# Patient Record
Sex: Female | Born: 1970 | Hispanic: No | Marital: Single | Smoking: Never smoker
Health system: Southern US, Community
[De-identification: ages and names within clinical notes are randomized; demographics above are authoritative.]

---

## 2001-11-09 ENCOUNTER — Other Ambulatory Visit: Admission: RE | Admit: 2001-11-09 | Discharge: 2001-11-09 | Payer: Self-pay | Admitting: *Deleted

## 2001-11-09 ENCOUNTER — Other Ambulatory Visit: Admission: RE | Admit: 2001-11-09 | Discharge: 2001-11-09 | Payer: Self-pay | Admitting: Family Medicine

## 2011-06-23 ENCOUNTER — Emergency Department (HOSPITAL_BASED_OUTPATIENT_CLINIC_OR_DEPARTMENT_OTHER)
Admission: EM | Admit: 2011-06-23 | Discharge: 2011-06-23 | Disposition: A | Discharge status: Home / Self Care | Payer: Worker's Compensation | Attending: Emergency Medicine | Admitting: Emergency Medicine

## 2011-06-23 DIAGNOSIS — M25519 Pain in unspecified shoulder: Secondary | ICD-10-CM | POA: Insufficient documentation

## 2011-06-23 DIAGNOSIS — X58XXXA Exposure to other specified factors, initial encounter: Secondary | ICD-10-CM | POA: Insufficient documentation

## 2011-06-23 NOTE — ED Notes (Signed)
C/o inured right shoulder at work yesterday-was unable to work today

## 2011-06-23 NOTE — ED Provider Notes (Addendum)
History     CSN: [id number] Arrival date & time: 06/23/2011  5:24 PM  Chief Complaint  Patient presents with  . Shoulder Injury     Patient is a 40 y.o. female presenting with shoulder injury.  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  She presents with 24 hours of constant right shoulder pain worse with movement of her right arm.  Her pain is not improved by anything.  Her pain is moderate in severity.  There is no associated weakness or numbness.  She has no other complaints.  She was sent here by her company or drug screen for possible injury on the job and further recommendations regarding return to work. Denies rash or fever. Denies neck pain. Denies Lower extremity complaints  History reviewed. No pertinent past medical history.  History reviewed. No pertinent past surgical history.  No family history on file.  History  Substance Use Topics  . Smoking status: Never Smoker   . Smokeless tobacco: Not on file  . Alcohol Use: No      Review of Systems  All other systems reviewed and are negative.    Allergies  Chocolate and Nsaids  Home Medications   Current Outpatient Rx  Name Route Sig Dispense Refill  . ACETAMINOPHEN 500 MG PO TABS Oral Take 500 mg by mouth once.      Marland Kitchen VITAMIN D (ERGOCALCIFEROL) 50000 UNITS PO CAPS Oral Take 50,000 Units by mouth every 7 (seven) days. Take on Tuesday       BP 121/68  Pulse 58  Temp(Src) 98.5 F (36.9 C) (Oral)  Resp 16  Ht 5\' 3"  (1.6 m)  Wt 170 lb (77.111 kg)  BMI 30.11 kg/m2  SpO2 100%  LMP 06/15/2011  Physical Exam  Nursing note and vitals reviewed. Constitutional: She is oriented to person, place, and time. She appears well-developed and well-nourished.  HENT:  Head: Normocephalic.  Eyes: EOM are normal.  Neck: Normal range of motion.  Pulmonary/Chest: Effort normal.  Musculoskeletal: Normal range of motion.       Mild pain to  palpation of right posterior shoulder.  She's had normal right clavicle and normal right a.c. joint.  No weakness of her right upper extremity.  2 normal right radial pulse.  No evidence of erythema swelling or ecchymosis to  Neurological: She is alert and oriented to person, place, and time.  Psychiatric: She has a normal mood and affect.    ED Course  Procedures (including critical care time)  Labs Reviewed - No data to display No results found.   1. Shoulder pain       MDM  This is well-appearing this is likely a right shoulder strain.  She will will be referred to sports medicine.  She's been instructed to take off work 2 days and then no heavy lifting x5 days        Lyanne Co, MD 06/23/11 1757  Lyanne Co, MD 07/05/11 [phone]

## 2013-02-28 ENCOUNTER — Other Ambulatory Visit: Payer: Self-pay

## 2013-03-05 ENCOUNTER — Encounter (HOSPITAL_COMMUNITY): Payer: Self-pay | Admitting: Obstetrics and Gynecology

## 2013-04-03 ENCOUNTER — Other Ambulatory Visit (HOSPITAL_COMMUNITY): Payer: Self-pay | Admitting: Obstetrics and Gynecology

## 2013-04-03 DIAGNOSIS — O09529 Supervision of elderly multigravida, unspecified trimester: Secondary | ICD-10-CM

## 2013-04-03 DIAGNOSIS — O30009 Twin pregnancy, unspecified number of placenta and unspecified number of amniotic sacs, unspecified trimester: Secondary | ICD-10-CM

## 2013-04-05 ENCOUNTER — Ambulatory Visit (HOSPITAL_COMMUNITY)
Admission: RE | Admit: 2013-04-05 | Discharge: 2013-04-05 | Disposition: A | Discharge status: Home / Self Care | Payer: 59 | Source: Ambulatory Visit | Attending: Obstetrics and Gynecology | Admitting: Obstetrics and Gynecology

## 2013-04-05 ENCOUNTER — Encounter (HOSPITAL_COMMUNITY): Payer: Self-pay

## 2013-04-05 VITALS — BP 89/65 | HR 81 | Wt 189.0 lb

## 2013-04-05 DIAGNOSIS — O09529 Supervision of elderly multigravida, unspecified trimester: Secondary | ICD-10-CM

## 2013-04-05 DIAGNOSIS — Z1389 Encounter for screening for other disorder: Secondary | ICD-10-CM | POA: Insufficient documentation

## 2013-04-05 DIAGNOSIS — Z363 Encounter for antenatal screening for malformations: Secondary | ICD-10-CM | POA: Insufficient documentation

## 2013-04-05 DIAGNOSIS — O358XX0 Maternal care for other (suspected) fetal abnormality and damage, not applicable or unspecified: Secondary | ICD-10-CM | POA: Insufficient documentation

## 2013-04-05 DIAGNOSIS — O30009 Twin pregnancy, unspecified number of placenta and unspecified number of amniotic sacs, unspecified trimester: Secondary | ICD-10-CM | POA: Insufficient documentation

## 2013-04-05 IMAGING — US US OB DETAIL EACH ADDL GEST + 14 WK
1 series · 14 of 28 positions shown · non-contrast
Comparison: none

[Series 1: us ob detail each addl gest + 14 wk · 0.19mm/px · 14 of 143 slices shown]
[im 6/143]
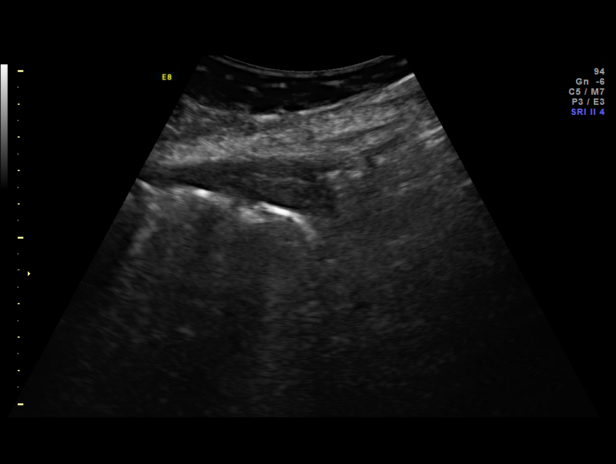
[im 16/143]
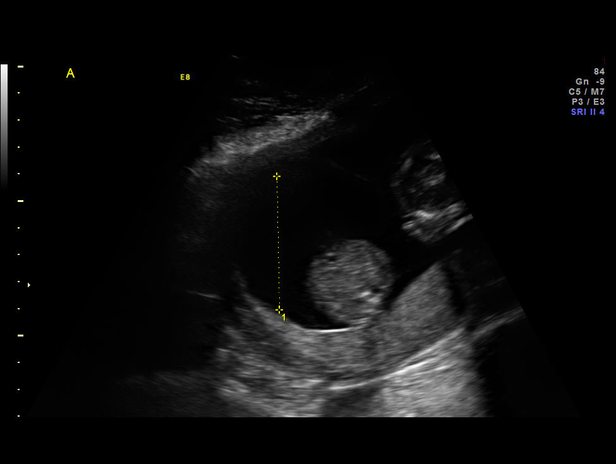
[im 27/143]
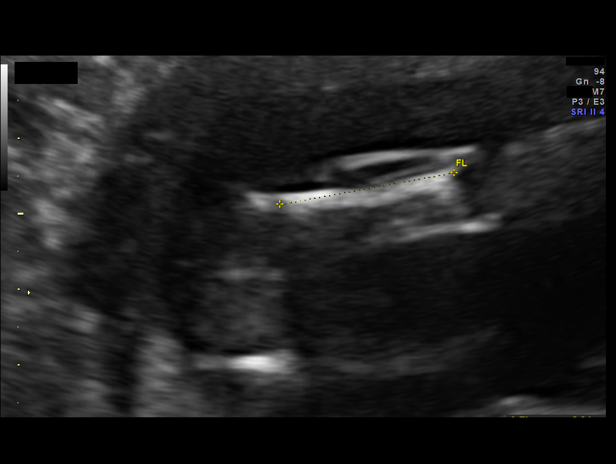
[im 37/143]
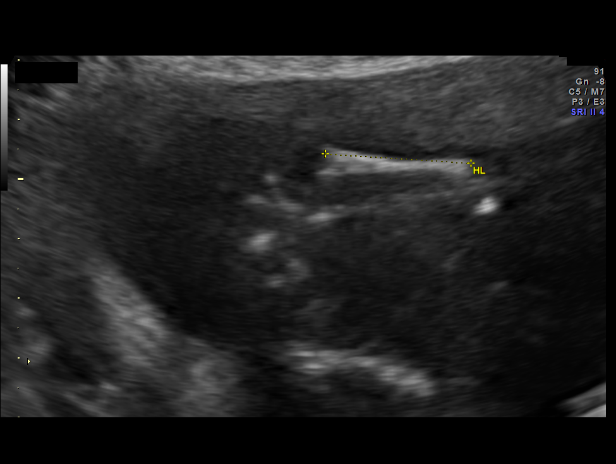
[im 48/143]
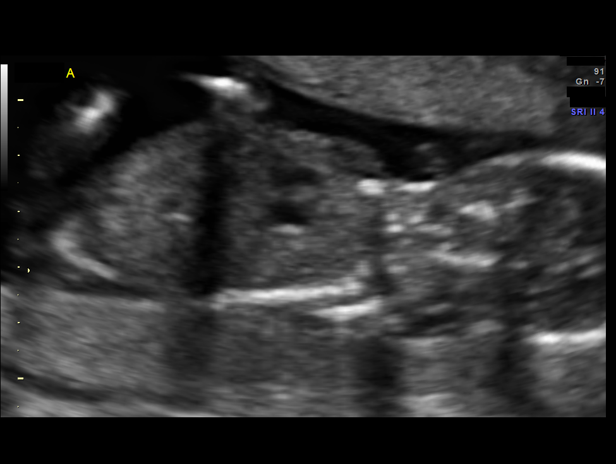
[im 58/143]
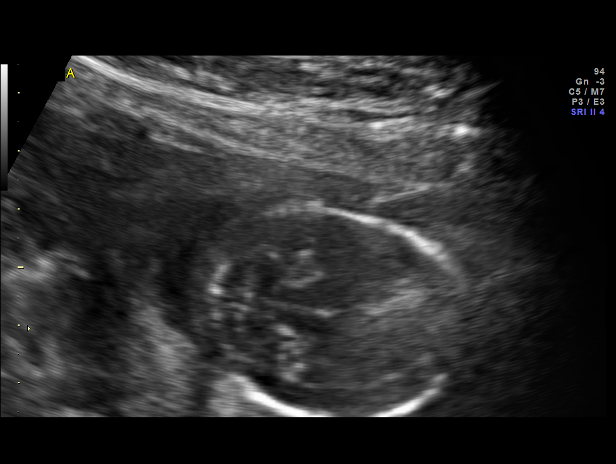
[im 69/143]
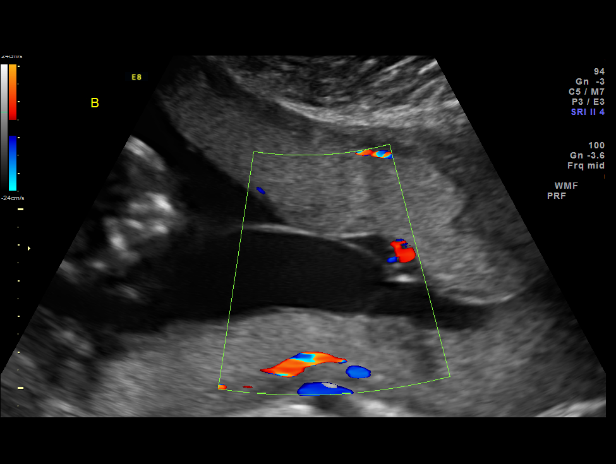
[im 79/143]
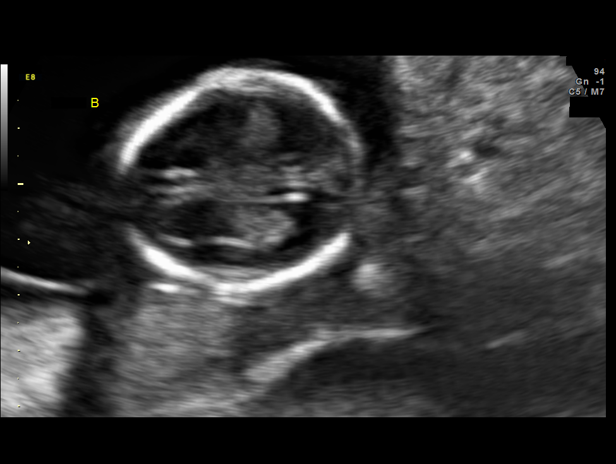
[im 90/143]
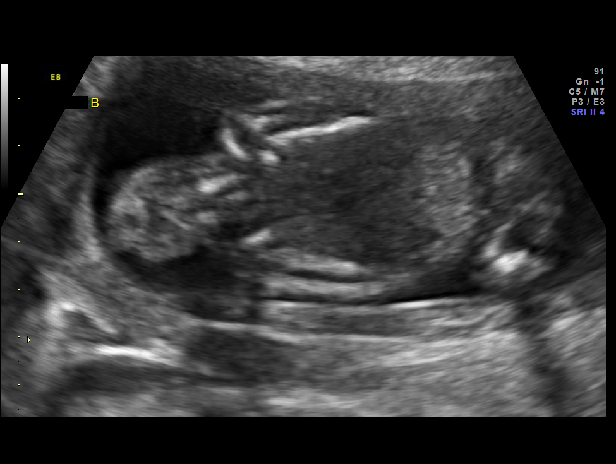
[im 100/143]
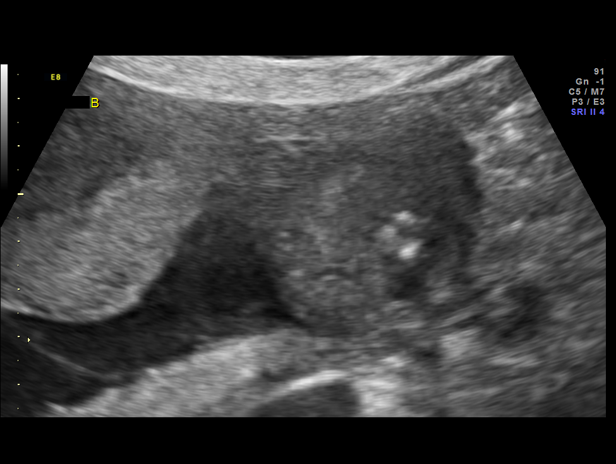
[im 111/143]
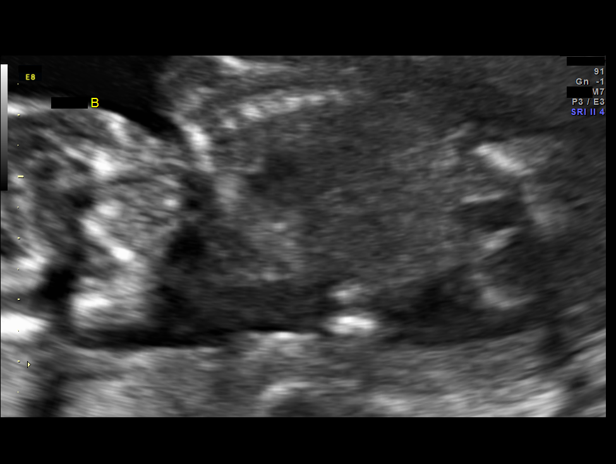
[im 121/143]
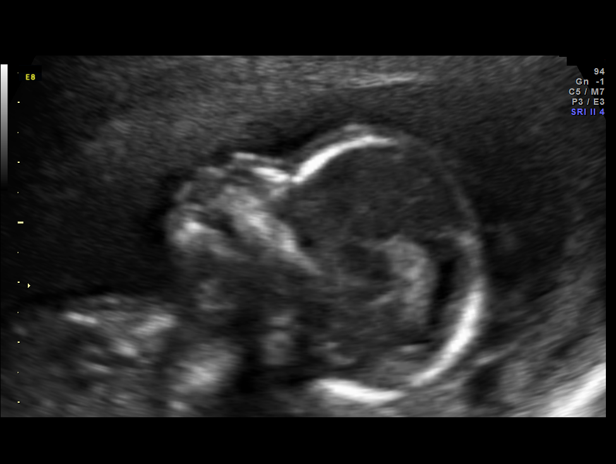
[im 132/143]
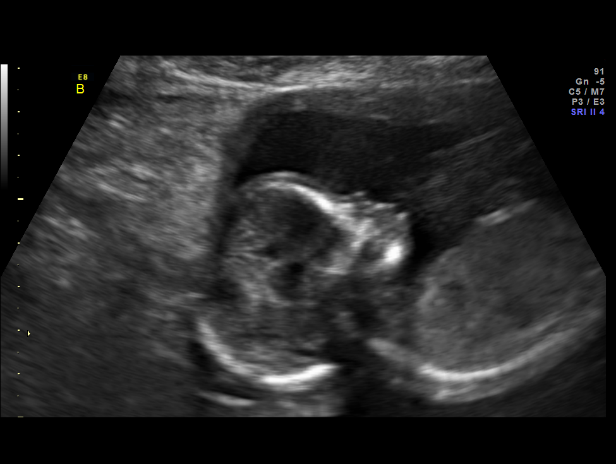
[im 143/143]
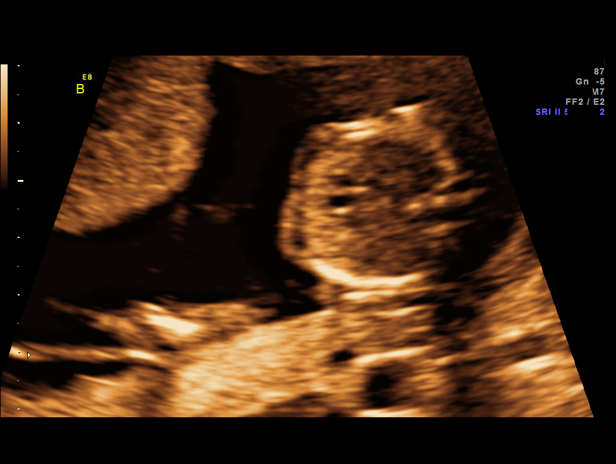

[14 of 28 positions shown; findings below may reference images not displayed]

OBSTETRICS REPORT
                      (Signed Final 04/05/2013 [DATE])

Service(s) Provided

 US OB DETAIL + 14 WK                                  76811.0
 US OB DETAIL ADDL GEST + 14 WK                        76811.1
Indications

 Detailed fetal anatomic survey
 Advanced maternal age (AMA), Multigravida
 Twin gestation, Di-Di
Fetal Evaluation (Fetus A)

 Num Of Fetuses:    2
 Fetal Heart Rate:  150                          bpm
 Cardiac Activity:  Observed
 Fetal Lie:         Lower Right Fetus
 Presentation:      Cephalic
 Placenta:          Posterior, above cervical
                    os
 P. Cord            Visualized
 Insertion:

 Amniotic Fluid
 AFI FV:      Subjectively within normal limits
                                             Larg Pckt:     5.0  cm
Biometry (Fetus A)

 BPD:     34.6  mm     G. Age:  16w 5d                CI:         72.7   70 - 86
 OFD:     47.6  mm                                    FL/HC:      18.2   15.8 -
                                                                         18
 HC:       133  mm     G. Age:  16w 6d        4  %    HC/AC:      1.15   1.07 -

 AC:     115.7  mm     G. Age:  17w 3d       26  %    FL/BPD:
 FL:      24.2  mm     G. Age:  17w 2d       21  %    FL/AC:      20.9   20 - 24
 HUM:     24.5  mm     G. Age:  17w 5d       44  %

 Est. FW:     187  gm      0 lb 7 oz     33  %     FW Discordancy         6  %
Gestational Age (Fetus A)

 U/S Today:     17w 1d                                        EDD:   09/12/13
 Best:          18w 0d     Det. By:  Early Ultrasound         EDD:   09/06/13
Anatomy (Fetus A)

 Cranium:          Appears normal         Aortic Arch:      Appears normal
 Fetal Cavum:      Appears normal         Ductal Arch:      Appears normal
 Ventricles:       Appears normal         Diaphragm:        Appears normal
 Choroid Plexus:   Appears normal         Stomach:          Appears normal, left
                                                            sided
 Cerebellum:       Appears normal         Abdomen:          Appears normal
 Posterior Fossa:  Appears normal         Abdominal Wall:   Appears nml (cord
                                                            insert, abd wall)
 Nuchal Fold:      Appears normal         Cord Vessels:     Appears normal (3
                                                            vessel cord)
 Face:             Appears normal         Kidneys:          Appear normal
                   (orbits and profile)
 Lips:             Appears normal         Bladder:          Appears normal
 Heart:            Appears normal         Spine:            Appears normal
                   (4CH, axis, and
                   situs)
 RVOT:             Appears normal         Lower             Appears normal
                                          Extremities:
 LVOT:             Appears normal         Upper             Appears normal
                                          Extremities:

 Other:  Fetus appears to be a female. Heels and 5th digit appear normal.

Fetal Evaluation (Fetus B)

 Num Of Fetuses:    2
 Fetal Heart Rate:  153                          bpm
 Cardiac Activity:  Observed
 Fetal Lie:         Upper Fetus
 Presentation:      Transverse, head to
                    maternal right
 Placenta:          Anterior, above cervical os
 P. Cord            Visualized
 Insertion:

 Amniotic Fluid
 AFI FV:      Subjectively within normal limits
                                             Larg Pckt:     4.0  cm
Biometry (Fetus B)

 BPD:       38  mm     G. Age:  17w 4d                CI:         76.9   70 - 86
 OFD:     49.4  mm                                    FL/HC:      17.9   15.8 -
                                                                         18
 HC:     139.4  mm     G. Age:  17w 2d       13  %    HC/AC:      1.18   1.07 -

 AC:     118.6  mm     G. Age:  17w 4d       34  %    FL/BPD:
 FL:        25  mm     G. Age:  17w 4d       29  %    FL/AC:      21.1   20 - 24
 HUM:     24.2  mm     G. Age:  17w 4d       41  %
 CER:     16.8  mm     G. Age:  16w 6d       23  %
 NFT:      2.7  mm

 Est. FW:     199  gm      0 lb 7 oz     39  %     FW Discordancy      0 \ 6 %
Gestational Age (Fetus B)

 U/S Today:     17w 4d                                        EDD:   09/09/13
 Best:          18w 0d     Det. By:  Early Ultrasound         EDD:   09/06/13
Anatomy (Fetus B)

 Cranium:          Appears normal         Aortic Arch:      Not well visualized
 Fetal Cavum:      Appears normal         Ductal Arch:      Not well visualized
 Ventricles:       Appears normal         Diaphragm:        Appears normal
 Choroid Plexus:   Appears normal         Stomach:          Appears normal, left
                                                            sided
 Cerebellum:       Appears normal         Abdomen:          Appears normal
 Posterior Fossa:  Appears normal         Abdominal Wall:   Appears nml (cord
                                                            insert, abd wall)
 Nuchal Fold:      Appears normal         Cord Vessels:     Appears normal (3
                                                            vessel cord)
 Face:             Appears normal         Kidneys:          Appear normal
                   (orbits and profile)
 Lips:             Appears normal         Bladder:          Appears normal
 Heart:            Not well visualized    Spine:            Appears normal
 RVOT:             Not well visualized    Lower             Appears normal
                                          Extremities:
 LVOT:             Appears normal         Upper             Appears normal
                                          Extremities:

 Other:  Heels and Lt 5th digit appear normal. Fetus appears to be a male.
Targeted Anatomy (Fetus B)

 Fetal Central Nervous System
 Cisterna Magna:
Cervix Uterus Adnexa

 Cervical Length:    4.5      cm

 Cervix:       Normal appearance by transabdominal scan. Appears
               closed, without funnelling.
 Left Ovary:    Within normal limits.
 Right Ovary:   Within normal limits.
Comments

 Ms. Kinaa was seen for consultation due to advanced
 maternal age and DC/DA twins.  See separate note from
 Genetics Counselor.  After counseling, the patient declined
 further genetic testing.
Impression

 DC/DA twin gestation with best dates of 18 0/7 weeks
 Concordant growth noted.

 Twin A: maternal right, cephalic, posterior placenta
               Normal fetal anatomic survey
               No markers associated with aneuploidy noted
               Normal amniotic fluid volume

 Twin B: transverse with fetal head to maternal right
               Normal fetal anatomic survey
               Limited views of the fetal heart obtained due to fetal
 position
               No markers assocaited with aneuploidy appreciated
               Normal amniotic fluid volume
Recommendations

 Recommend follow-up ultrasound examination in 4 weeks for
 interval growth and to reevaluate fetal heart anatomy in twin
 B.

## 2013-04-05 NOTE — Progress Notes (Signed)
Genetic Counseling  High-Risk Gestation Note  Appointment Date:  04/05/2013 Referred By: Heidi Bailiff, MD Date of Birth:  [birth year]    Pregnancy History: [id number] Estimated Date of Delivery: 09/06/13 Estimated Gestational Age: [redacted]w[redacted]d Attending: Alpha Gula, MD   [name] was seen for genetic counseling because of a maternal age of 42 y.o..     She was counseled regarding maternal age and the association with risk for chromosome conditions due to nondisjunction with aging of the ova.   We reviewed chromosomes, nondisjunction, and the associated 1 in 35 risk for fetal aneuploidy for each twin at [redacted]w[redacted]d gestation related to a maternal age of 42 years old at delivery. The patient reported that she is not certain of her age, thus we discussed that this estimate is based upon the provided birth date that she estimated. She was counseled that the risk for aneuploidy decreases as gestational age increases, accounting for those pregnancies which spontaneously abort.  We specifically discussed Down syndrome (trisomy 12), trisomies 29 and 57, and sex chromosome aneuploidies (47,XXX and 47,XXY) including the common features and prognoses of each.   We reviewed available screening options including Quad screen, noninvasive prenatal screening (NIPS)/cell free fetal DNA (cffDNA) testing, and detailed ultrasound.  She was counseled that screening tests are used to modify a patient's a priori risk for aneuploidy, typically based on age. This estimate provides a pregnancy specific risk assessment. We reviewed the benefits and limitations of each option. Specifically, we discussed the conditions for which each test screens, the detection rates, and false positive rates of each. Detection rates are decreased in a twin gestation. She was also counseled regarding diagnostic testing via amniocentesis. We reviewed the approximate 1 in 300-500 risk for complications for amniocentesis, including spontaneous  pregnancy loss. After consideration of all the options, she declined screening and testing for aneuploidy via Quad screen, NIPS, and amniocentesis.    The patient expressed interest in having a detailed ultrasound only.  A complete ultrasound was performed today. The ultrasound report will be sent under separate cover. There were no visualized fetal anomalies or markers suggestive of aneuploidy. Diagnostic testing was declined today.  We discussed that screening tests cannot rule out all birth defects or genetic syndromes. The patient was advised of this limitation and states she still does not want additional testing at this time.   Given the patient's African ancestry, screening for sickle cell anemia (SCA) is available. In addition, hemoglobinopathies are routinely screened for as part of the [facility] newborn screening panel.  Hemoglobin electrophoresis is available to the patient, if not previously performed and if desired.  Both family histories were reviewed and found to be noncontributory for birth defects, mental retardation, and known genetic conditions. Without further information regarding the provided family history, an accurate genetic risk cannot be calculated. Further genetic counseling is warranted if more information is obtained.  The father of the pregnancy is 52 years old. Advanced paternal age (APA) is defined as paternal age greater than or equal to age 47.  Recent large-scale sequencing studies have shown that approximately 80% of de novo point mutations are of paternal origin.  Many studies have demonstrated a strong correlation between increased paternal age and de novo point mutations.  Although no specific data is available regarding fetal risks for fathers 22+ years old at conception, it is apparent that the overall risk for single gene conditions is increased.  To estimate the relative increase in risk of a genetic disorder with APA,  the heritability of the disease must be considered.   Assuming an approximate 2x increase in risk for conditions that are exclusively paternal in origin, the risk for each individual condition is still relatively low.  It is estimated that the overall chance for a de novo mutation is ~0.5%. There is a wide range of conditions which can be caused by new dominant gene mutations (achondroplasia, neurofibromatosis, Marfan syndrome etc.).  Genetic testing for each individual single gene condition is not warranted or available unless ultrasound or family history concerns lend suspicion to a specific condition.    [name] denied exposure to environmental toxins or chemical agents. She reported having one x ray of the pelvis prior to being aware of the pregnancy that either occurred on 10/25/12 or 11/22/12. Given the reported timing of this xray, this would have been prior to conception of the current pregnancy and thus would not be expected to increase the risk for adverse pregnancy outcome. She denied the use of alcohol, tobacco or street drugs. She denied significant viral illnesses during the course of her pregnancy. Her medical and surgical histories were noncontributory.   I counseled [name] regarding the above risks and available options.  The approximate face-to-face time with the genetic counselor was 20 minutes.  Heidi Plowman, MS,  Certified [employer] 04/05/2013

## 2013-04-05 NOTE — Progress Notes (Signed)
Heidi Sims  was seen today for an ultrasound appointment.  See full report in [facility].  Comments: [name] was seen for consultation due to advanced maternal age and DC/DA twins.  See separate note from [facility].  After counseling, the patient declined further genetic testing.  Impression: DC/DA twin gestation with best dates of 18 0/7 weeks Concordant growth noted.  Twin A: maternal right, cephalic, posterior placenta               Normal fetal anatomic survey               No markers associated with aneuploidy noted               Normal amniotic fluid volume  Twin B: transverse with fetal head to maternal right               Normal fetal anatomic survey               Limited views of the fetal heart obtained due to fetal position               No markers assocaited with aneuploidy appreciated               Normal amniotic fluid volume  Recommendations: Recommend follow-up ultrasound examination in 4 weeks for interval growth and to reevaluate fetal heart anatomy in twin B.  Alpha Gula, MD

## 2013-05-03 ENCOUNTER — Ambulatory Visit (HOSPITAL_COMMUNITY)
Admission: RE | Admit: 2013-05-03 | Discharge: 2013-05-03 | Disposition: A | Discharge status: Home / Self Care | Payer: 59 | Source: Ambulatory Visit | Attending: Obstetrics and Gynecology | Admitting: Obstetrics and Gynecology

## 2013-05-03 DIAGNOSIS — Z3689 Encounter for other specified antenatal screening: Secondary | ICD-10-CM | POA: Insufficient documentation

## 2013-05-03 DIAGNOSIS — O30009 Twin pregnancy, unspecified number of placenta and unspecified number of amniotic sacs, unspecified trimester: Secondary | ICD-10-CM

## 2013-05-03 DIAGNOSIS — O09529 Supervision of elderly multigravida, unspecified trimester: Secondary | ICD-10-CM | POA: Insufficient documentation

## 2013-05-03 IMAGING — US US OB FOLLOW-UP EACH ADDL GEST (MODIFY)
1 series · 15 of 28 positions shown · non-contrast
Comparison: none

[Series 1: us ob follow-up each addl gest (modify) · 0.23mm/px · 15 of 69 slices shown]
[im 1/69]
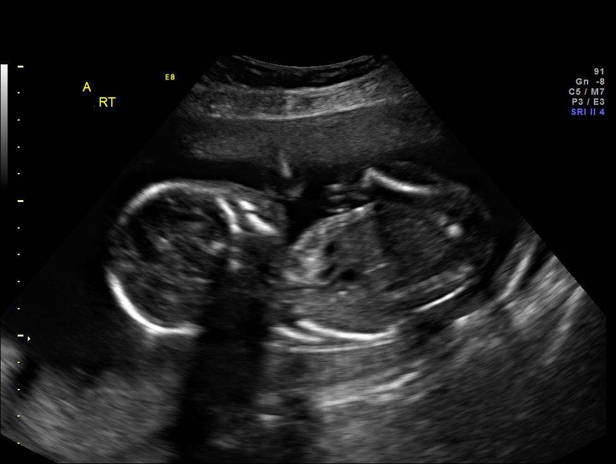
[im 6/69]
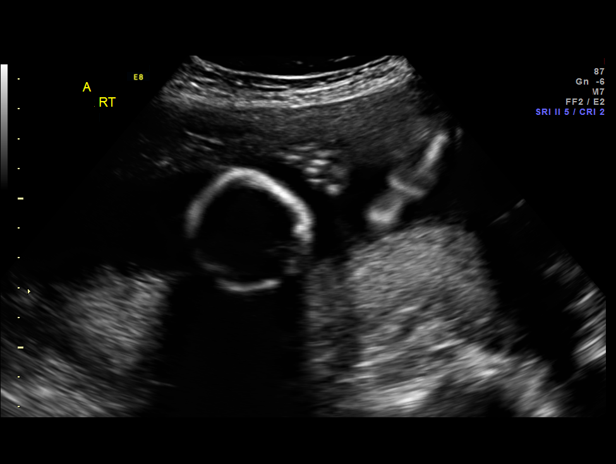
[im 11/69]
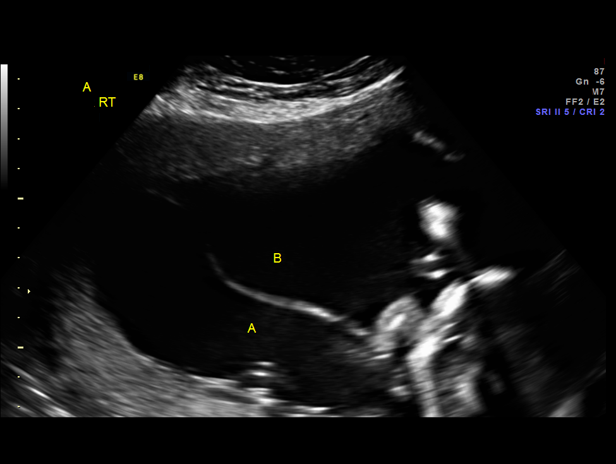
[im 16/69]
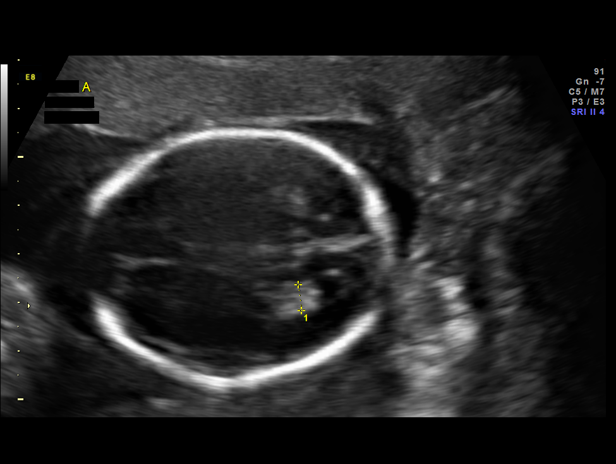
[im 21/69]
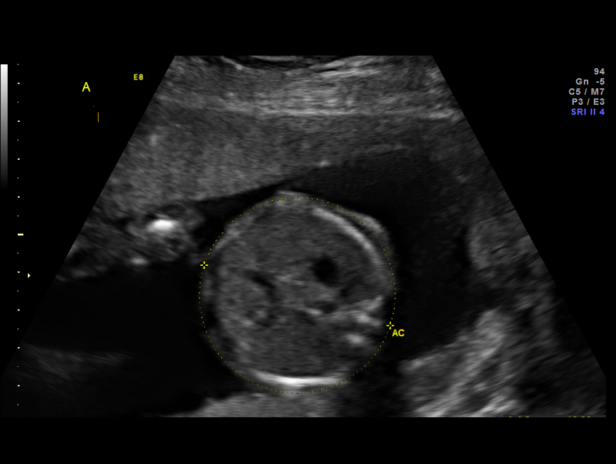
[im 26/69]
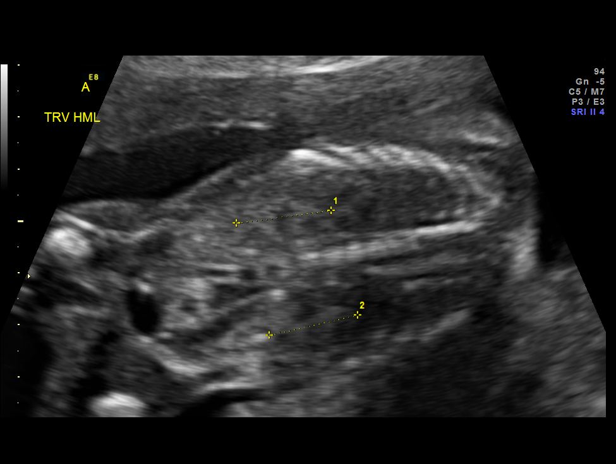
[im 31/69]
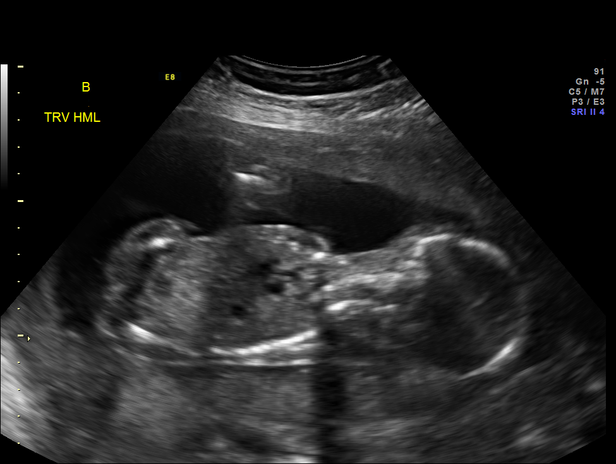
[im 36/69]
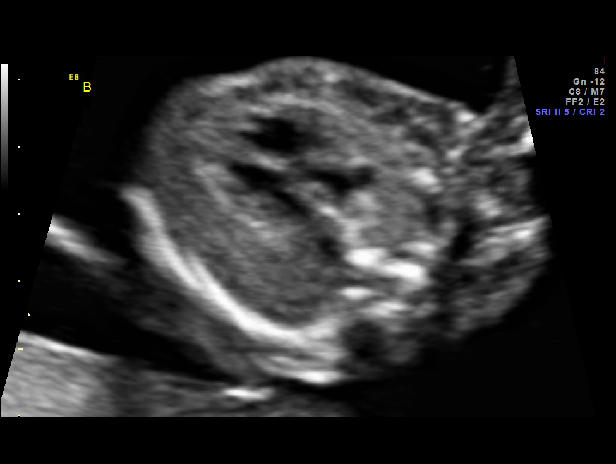
[im 38/69]
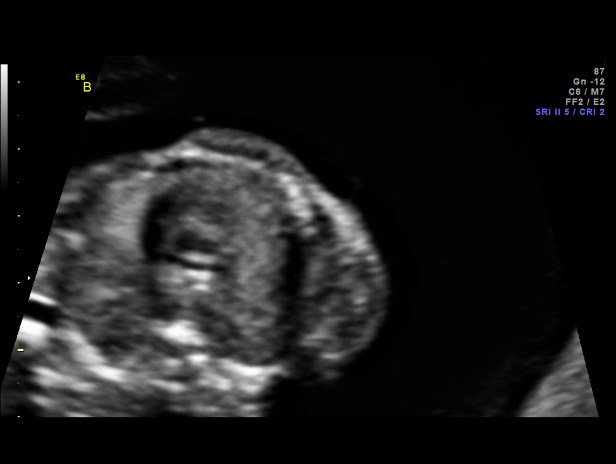
[im 43/69]
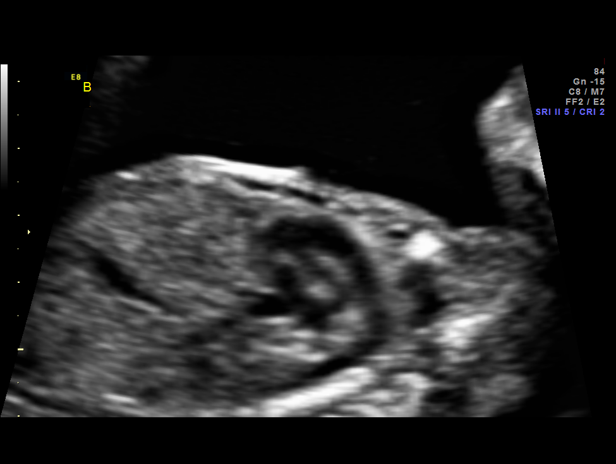
[im 48/69]
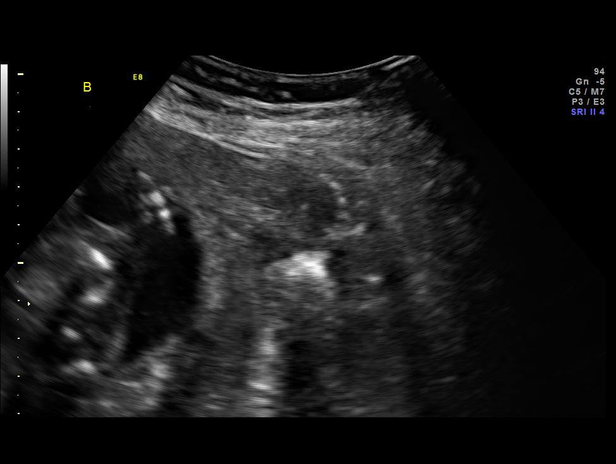
[im 53/69]
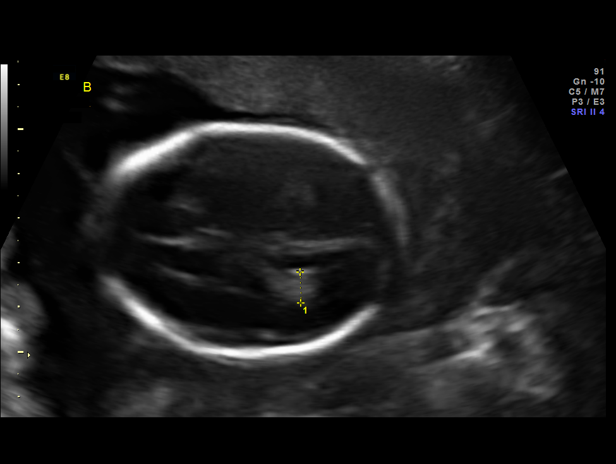
[im 58/69]
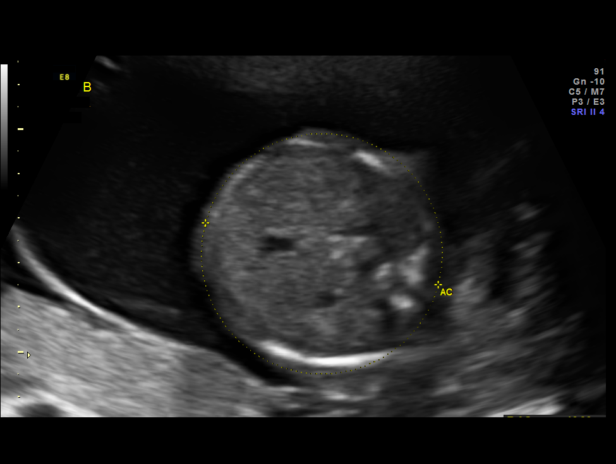
[im 63/69]
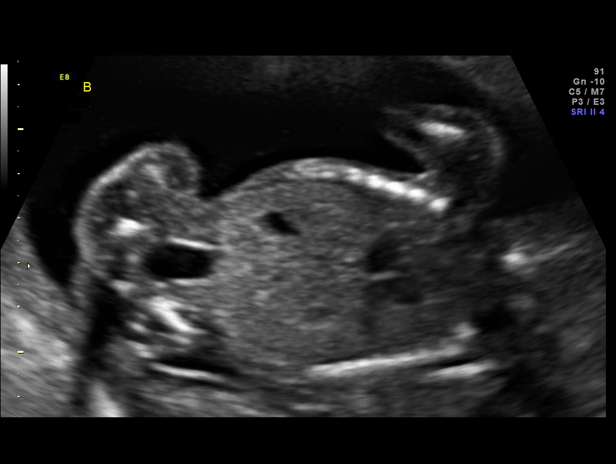
[im 69/69]
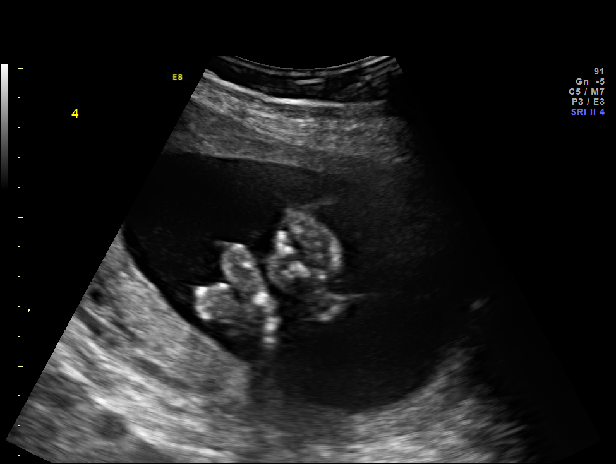

[15 of 28 positions shown; findings below may reference images not displayed]

OBSTETRICS REPORT
                      (Signed Final 05/03/2013 [DATE])

Service(s) Provided

 US OB FOLLOW UP                                       76816.1
 US OB FOLLOW UP ADDL GEST                             76816.2
Indications

 Follow-up incomplete fetal anatomic evaluation
 Advanced maternal age (42), Multigravida -
 declined screening
 Twin gestation, Di-Di
Fetal Evaluation (Fetus A)

 Num Of Fetuses:    2
 Cardiac Activity:  Observed
 Fetal Lie:         Lower Fetus
 Presentation:      Variable
 Placenta:          Posterior, above cervical
                    os
 P. Cord            Previously Visualized
 Insertion:

 Membrane Desc:     Dividing
                    Membrane seen
                    - Dichorionic.

 Amniotic Fluid
 AFI FV:      Subjectively within normal limits
                                             Larg Pckt:     7.5  cm
Biometry (Fetus A)

 BPD:     50.5  mm     G. Age:  21w 2d                CI:         76.9   70 - 86
 OFD:     65.7  mm                                    FL/HC:      20.6   18.4 -

 HC:     186.6  mm     G. Age:  21w 0d        8  %    HC/AC:      1.12   1.06 -

 AC:     166.1  mm     G. Age:  21w 4d       31  %    FL/BPD:     76.0   71 - 87
 FL:      38.4  mm     G. Age:  22w 2d       50  %    FL/AC:      23.1   20 - 24
 HUM:     34.4  mm     G. Age:  21w 5d       43  %
 CER:     22.3  mm     G. Age:  21w 1d       25  %

 Est. FW:     453  gm           1 lb     40  %     FW Discordancy      0 \ 5 %
Gestational Age (Fetus A)
 U/S Today:     21w 4d                                        EDD:   09/09/13
 Best:          22w 0d     Det. By:  Early Ultrasound         EDD:   09/06/13
Anatomy (Fetus A)

 Cranium:          Appears normal         Aortic Arch:      Previously seen
 Fetal Cavum:      Appears normal         Ductal Arch:      Previously seen
 Ventricles:       Appears normal         Diaphragm:        Previously seen
 Choroid Plexus:   Previously seen        Stomach:          Appears normal, left
                                                            sided
 Cerebellum:       Appears normal         Abdomen:          Appears normal
 Posterior Fossa:  Appears normal         Abdominal Wall:   Previously seen
 Nuchal Fold:      Previously seen        Cord Vessels:     Previously seen
 Face:             Orbits and profile     Kidneys:          Appear normal
                   previously seen
 Lips:             Previously seen        Bladder:          Appears normal
 Heart:            Appears normal         Spine:            Previously seen
                   (4CH, axis, and
                   situs)
 RVOT:             Previously seen        Lower             Previously seen
                                          Extremities:
 LVOT:             Previously seen        Upper             Previously seen
                                          Extremities:

 Other:  Fetus appears to be a female. Heels and 5th digit previously seen.
         Nasal bone visualized.
Targeted Anatomy (Fetus A)

 Fetal Central Nervous System
 Cisterna Magna:

Fetal Evaluation (Fetus B)

 Num Of Fetuses:    2
 Cardiac Activity:  Observed
 Fetal Lie:         Upper Fetus
 Presentation:      Variable
 Placenta:          Anterior, above cervical os
 P. Cord            Previously Visualized
 Insertion:

 Membrane Desc:     Dividing
                    Membrane seen
                    - Dichorionic.

 Amniotic Fluid
 AFI FV:      Subjectively within normal limits
                                             Larg Pckt:     7.4  cm
Biometry (Fetus B)

 BPD:     50.3  mm     G. Age:  21w 2d                CI:         76.8   70 - 86
 OFD:     65.5  mm                                    FL/HC:      19.2   18.4 -

 HC:     186.4  mm     G. Age:  21w 0d        8  %    HC/AC:      1.11   1.06 -

 AC:     167.9  mm     G. Age:  21w 6d       36  %    FL/BPD:     71.2   71 - 87
 FL:      35.8  mm     G. Age:  21w 2d       20  %    FL/AC:      21.3   20 - 24
 HUM:     35.5  mm     G. Age:  22w 2d       56  %
 CER:     21.5  mm     G. Age:  20w 4d       13  %

 Est. FW:     430  gm    0 lb 15 oz      32  %     FW Discordancy         5  %
Gestational Age (Fetus B)

 U/S Today:     21w 3d                                        EDD:   09/10/13
 Best:          22w 0d     Det. By:  Early Ultrasound         EDD:   09/06/13
Anatomy (Fetus B)

 Cranium:          Appears normal         Aortic Arch:      Appears normal
 Fetal Cavum:      Appears normal         Ductal Arch:      Appears normal
 Ventricles:       Appears normal         Diaphragm:        Previously seen
 Choroid Plexus:   Previously seen        Stomach:          Appears normal, left
                                                            sided
 Cerebellum:       Appears normal         Abdomen:          Appears normal
 Posterior Fossa:  Appears normal         Abdominal Wall:   Previously seen
 Nuchal Fold:      Previously seen        Cord Vessels:     Previously seen
 Face:             Orbits and profile     Kidneys:          Appear normal
                   previously seen
 Lips:             Previously seen        Bladder:          Appears normal
 Heart:            Appears normal         Spine:            Previously seen
                   (4CH, axis, and
                   situs)
 RVOT:             Appears normal         Lower             Previously seen
                                          Extremities:
 LVOT:             Appears normal         Upper             Previously seen
                                          Extremities:

 Other:  Fetus appears to be a male. Heels and 5th digit previously seen.
Targeted Anatomy (Fetus B)

 Fetal Central Nervous System
 Cisterna Magna:
Cervix Uterus Adnexa

 Cervical Length:    3.3      cm

 Cervix:       Normal appearance by transabdominal scan.
 Left Ovary:    Within normal limits.
 Right Ovary:   Within normal limits.
 Adnexa:     No abnormality visualized.
Impression

 Dichorionic/diamniotic twin pregnancy at 22+0 weeks
 Normal interval anatomy; anatomic survey complete x 2
 Normal amniotic fluid volume x 2
 Appropriate interval growth with EFW at the 40th and 32nd
 %tiles
Recommendations

 Follow-up ultrasound for growth in 4 weeks

## 2013-05-30 ENCOUNTER — Other Ambulatory Visit (HOSPITAL_COMMUNITY): Payer: Self-pay | Admitting: Obstetrics and Gynecology

## 2013-05-30 DIAGNOSIS — O09529 Supervision of elderly multigravida, unspecified trimester: Secondary | ICD-10-CM

## 2013-05-31 ENCOUNTER — Other Ambulatory Visit (HOSPITAL_COMMUNITY): Payer: Self-pay | Admitting: Obstetrics and Gynecology

## 2013-05-31 ENCOUNTER — Ambulatory Visit (HOSPITAL_COMMUNITY)
Admission: RE | Admit: 2013-05-31 | Discharge: 2013-05-31 | Disposition: A | Discharge status: Home / Self Care | Payer: 59 | Source: Ambulatory Visit | Attending: Obstetrics and Gynecology | Admitting: Obstetrics and Gynecology

## 2013-05-31 DIAGNOSIS — O09529 Supervision of elderly multigravida, unspecified trimester: Secondary | ICD-10-CM

## 2013-05-31 DIAGNOSIS — O30009 Twin pregnancy, unspecified number of placenta and unspecified number of amniotic sacs, unspecified trimester: Secondary | ICD-10-CM | POA: Insufficient documentation

## 2013-05-31 IMAGING — US US OB FOLLOW-UP
1 series · 15 of 28 positions shown · non-contrast
Comparison: none

[Series 1: us ob follow-up · 0.18mm/px · 15 of 69 slices shown]
[im 1/69]
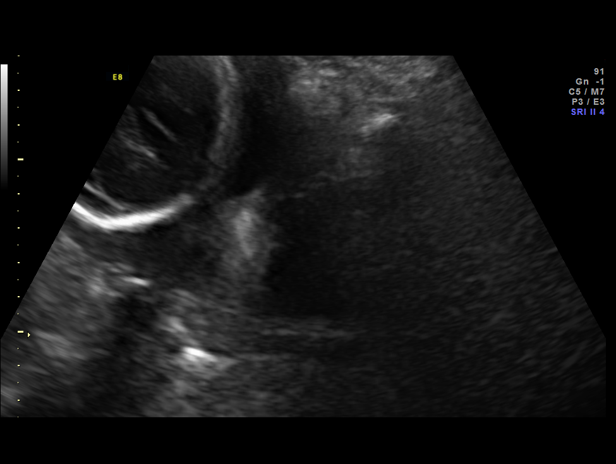
[im 6/69]
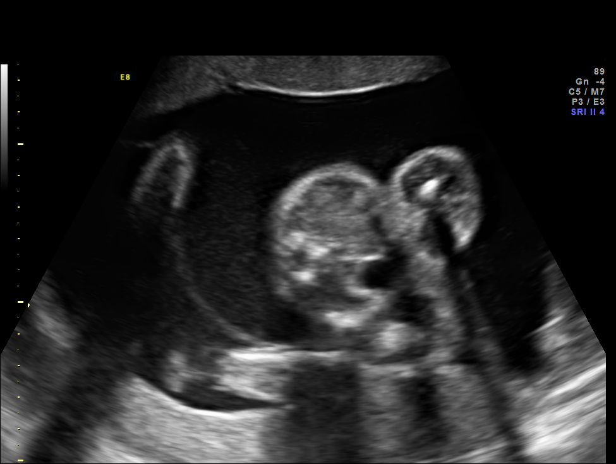
[im 11/69]
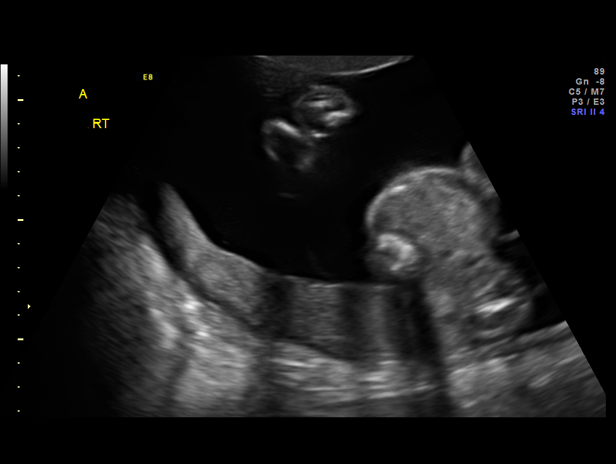
[im 16/69]
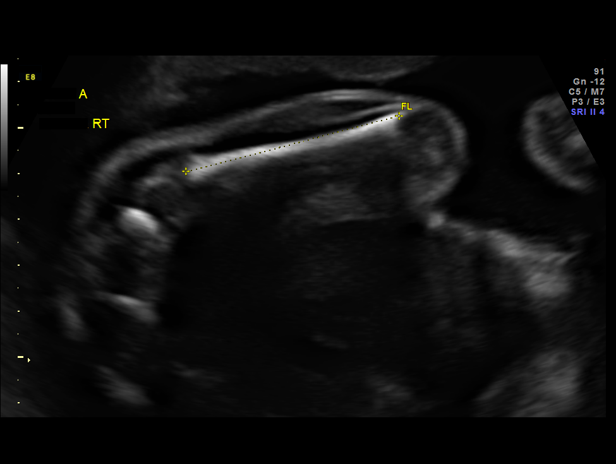
[im 21/69]
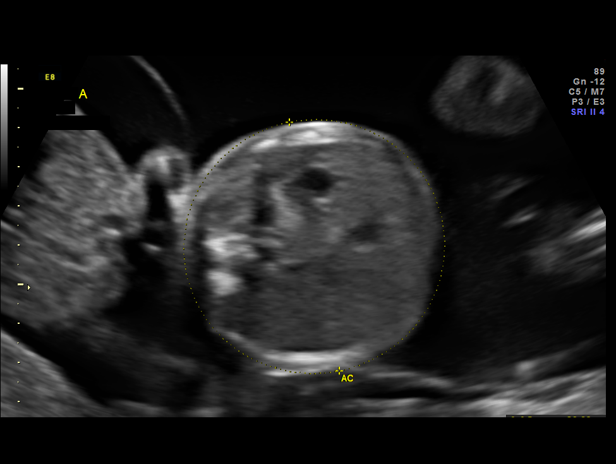
[im 26/69]
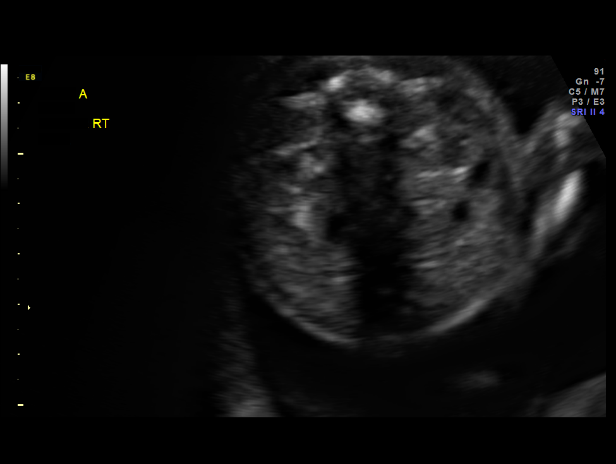
[im 31/69]
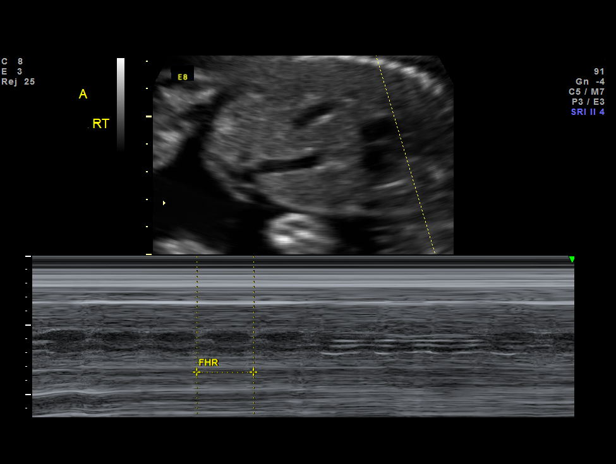
[im 36/69]
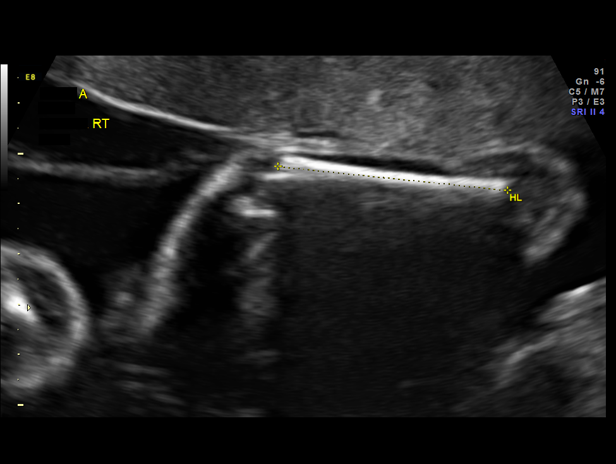
[im 38/69]
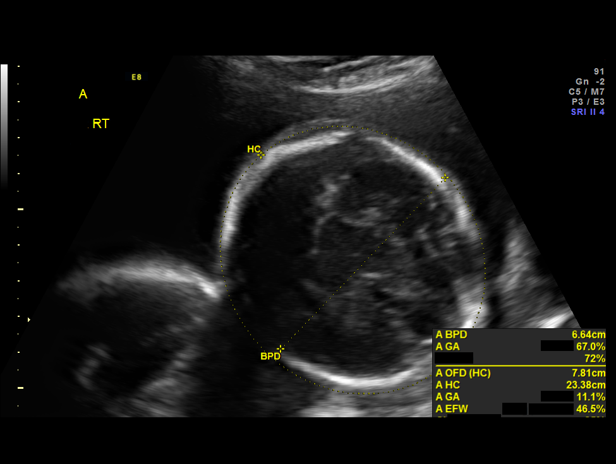
[im 43/69]
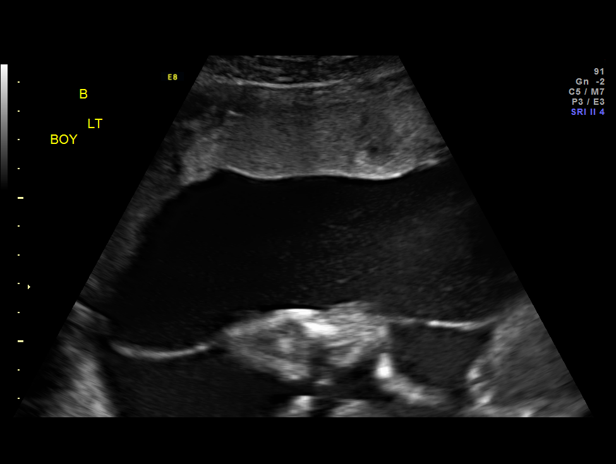
[im 48/69]
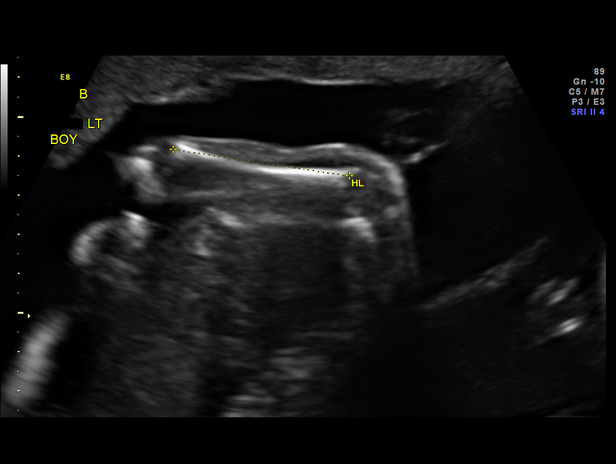
[im 53/69]
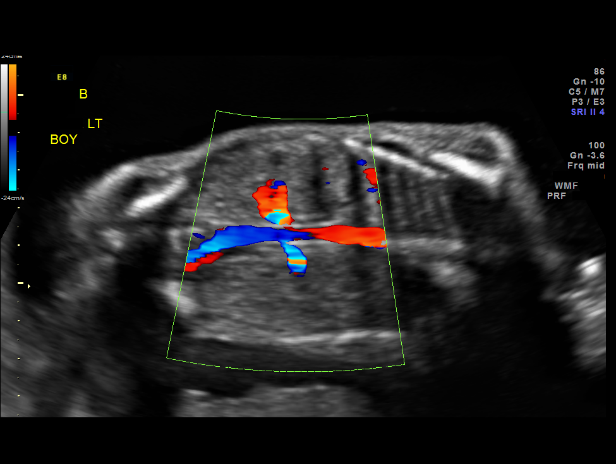
[im 58/69]
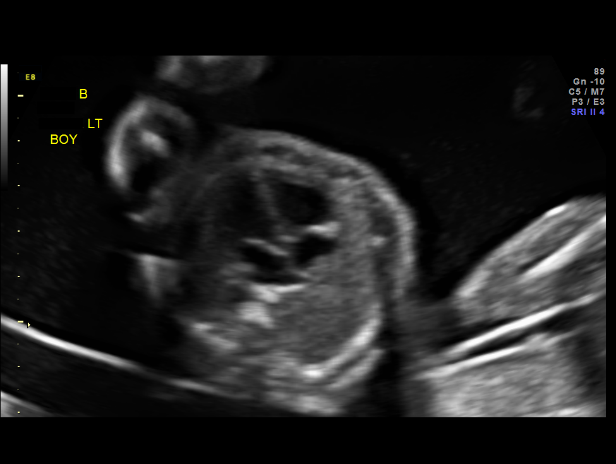
[im 63/69]
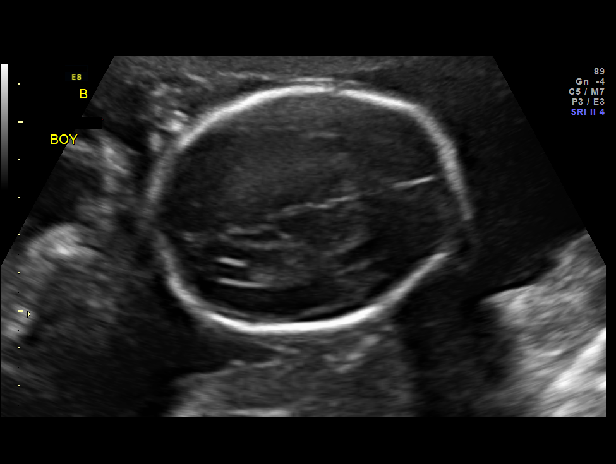
[im 69/69]
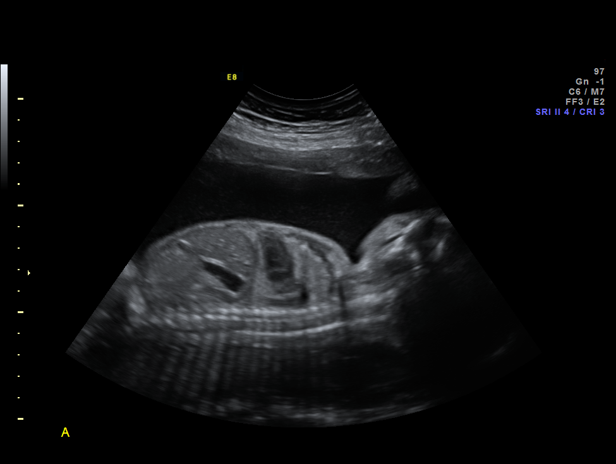

[15 of 28 positions shown; findings below may reference images not displayed]

OBSTETRICS REPORT
                      (Signed Final 05/31/2013 [DATE])

Service(s) Provided

 US OB FOLLOW UP                                       76816.1
 US OB FOLLOW UP ADDL GEST                             76816.2
Indications

 Advanced maternal age (42), Multigravida -
 declined screening
 Twin gestation, Di-Di
Fetal Evaluation (Fetus A)

 Num Of Fetuses:    2
 Fetal Heart Rate:  150                          bpm
 Cardiac Activity:  Observed
 Fetal Lie:         Lower Fetus
 Presentation:      Cephalic
 Placenta:          Posterior, above cervical
                    os
 P. Cord            Previously Visualized
 Insertion:

 Membrane Desc:     Dividing
                    Membrane seen
                    - Dichorionic.

 Amniotic Fluid
 AFI FV:      Subjectively upper-normal
                                             Larg Pckt:    8.30  cm
Biometry (Fetus A)

 BPD:     64.8  mm     G. Age:  26w 2d                CI:         81.4   70 - 86
 OFD:     79.6  mm                                    FL/HC:      20.5   18.6 -

 HC:     235.2  mm     G. Age:  25w 4d       15  %    HC/AC:      1.14   1.04 -

 AC:     206.6  mm     G. Age:  25w 2d       20  %    FL/BPD:     74.5   71 - 87
 FL:      48.3  mm     G. Age:  26w 2d       43  %    FL/AC:      23.4   20 - 24
 HUM:     45.8  mm     G. Age:  27w 0d       70  %

 Est. FW:     838  gm    1 lb 14 oz      48  %     FW Discordancy      0 \ 2 %
Gestational Age (Fetus A)
 U/S Today:     25w 6d                                        EDD:   09/07/13
 Best:          26w 0d     Det. By:  Early Ultrasound         EDD:   09/06/13
Anatomy (Fetus A)

 Cranium:          Previously seen        Aortic Arch:      Previously seen
 Fetal Cavum:      Previously seen        Ductal Arch:      Previously seen
 Ventricles:       Previously seen        Diaphragm:        Appears normal
 Choroid Plexus:   Previously seen        Stomach:          Appears normal, left
                                                            sided
 Cerebellum:       Previously seen        Abdomen:          Previously seen
 Posterior Fossa:  Previously seen        Abdominal Wall:   Previously seen
 Nuchal Fold:      Previously seen        Cord Vessels:     Previously seen
 Face:             Orbits and profile     Kidneys:          Appear normal
                   previously seen
 Lips:             Previously seen        Bladder:          Appears normal
 Heart:            Previously seen        Spine:            Previously seen
 RVOT:             Previously seen        Lower             Previously seen
                                          Extremities:
 LVOT:             Previously seen        Upper             Previously seen
                                          Extremities:

 Other:  Fetus appears to be a female. Heels and 5th digit previously seen.

Fetal Evaluation (Fetus B)

 Num Of Fetuses:    2
 Fetal Heart Rate:  145                          bpm
 Cardiac Activity:  Observed
 Fetal Lie:         Upper Fetus
 Presentation:      Cephalic
 Placenta:          Anterior, above cervical os
 P. Cord            Previously Visualized
 Insertion:

 Membrane Desc:     Dividing
                    Membrane seen
                    - Dichorionic.

 Amniotic Fluid
 AFI FV:      Subjectively upper-normal
                                             Larg Pckt:     8.0  cm
Biometry (Fetus B)

 BPD:     63.7  mm     G. Age:  25w 5d                CI:         73.1   70 - 86
 OFD:     87.1  mm                                    FL/HC:      19.1   18.6 -

 HC:     242.3  mm     G. Age:  26w 2d       36  %    HC/AC:      1.16   1.04 -

 AC:     209.1  mm     G. Age:  25w 4d       25  %    FL/BPD:     72.5   71 - 87
 FL:      46.2  mm     G. Age:  25w 3d       19  %    FL/AC:      22.1   20 - 24
 HUM:     44.9  mm     G. Age:  26w 4d       60  %

 Est. FW:     819  gm    1 lb 13 oz      44  %     FW Discordancy         2  %
Gestational Age (Fetus B)
 U/S Today:     25w 5d                                        EDD:   09/08/13
 Best:          26w 0d     Det. By:  Early Ultrasound         EDD:   09/06/13
Anatomy (Fetus B)

 Cranium:          Previously seen        Aortic Arch:      Previously seen
 Fetal Cavum:      Previously seen        Ductal Arch:      Previously seen
 Ventricles:       Previously seen        Diaphragm:        Appears normal
 Choroid Plexus:   Previously seen        Stomach:          Appears normal, left
                                                            sided
 Cerebellum:       Previously seen        Abdomen:          Previously seen
 Posterior Fossa:  Previously seen        Abdominal Wall:   Previously seen
 Nuchal Fold:      Previously seen        Cord Vessels:     Previously seen
 Face:             Orbits and profile     Kidneys:          Appear normal
                   previously seen
 Lips:             Previously seen        Bladder:          Appears normal
 Heart:            Appears normal         Spine:            Previously seen
                   (4CH, axis, and
                   situs)
 RVOT:             Appears normal         Lower             Previously seen
                                          Extremities:
 LVOT:             Appears normal         Upper             Previously seen
                                          Extremities:

 Other:  Fetus appears to be a male. Heels and 5th digit previously seen.
Cervix Uterus Adnexa

 Cervical Length:    3.1      cm

 Cervix:       Normal appearance by transabdominal scan. Appears
               closed, without funnelling.
Impression

 Dichorionic/diamniotic twin pregnancy at 26+0 weeks
 Normal interval anatomy; anatomic survey complete x 2
 High normal amniotic fluid volume x 2
 Appropriate interval growths with EFWs at the 48th and 44th
 %tiles
Recommendations

 Follow-up ultrasound for growth in 4 weeks

## 2013-06-27 ENCOUNTER — Other Ambulatory Visit (HOSPITAL_COMMUNITY): Payer: Self-pay | Admitting: Obstetrics and Gynecology

## 2013-06-27 DIAGNOSIS — O30009 Twin pregnancy, unspecified number of placenta and unspecified number of amniotic sacs, unspecified trimester: Secondary | ICD-10-CM

## 2013-06-27 DIAGNOSIS — O09529 Supervision of elderly multigravida, unspecified trimester: Secondary | ICD-10-CM

## 2013-06-28 ENCOUNTER — Ambulatory Visit (HOSPITAL_COMMUNITY)
Admission: RE | Admit: 2013-06-28 | Discharge: 2013-06-28 | Disposition: A | Discharge status: Home / Self Care | Payer: 59 | Source: Ambulatory Visit | Attending: Obstetrics and Gynecology | Admitting: Obstetrics and Gynecology

## 2013-06-28 DIAGNOSIS — O30009 Twin pregnancy, unspecified number of placenta and unspecified number of amniotic sacs, unspecified trimester: Secondary | ICD-10-CM | POA: Insufficient documentation

## 2013-06-28 DIAGNOSIS — O09529 Supervision of elderly multigravida, unspecified trimester: Secondary | ICD-10-CM | POA: Insufficient documentation

## 2013-06-28 IMAGING — US US OB FOLLOW-UP
1 series · 15 of 28 positions shown · non-contrast
Comparison: none

[Series 1: us ob follow-up · 0.23mm/px · 54 acquisitions, 15 frames shown]
[im 1/54]
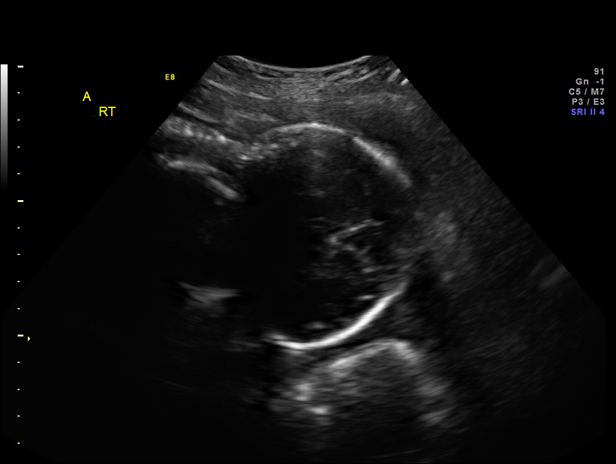
[im 4/54]
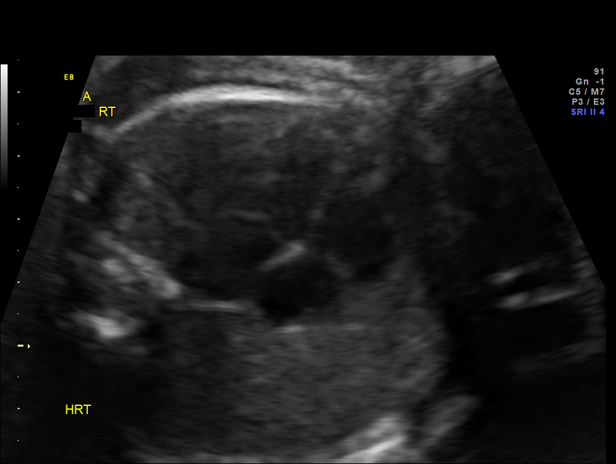
[im 8/54]
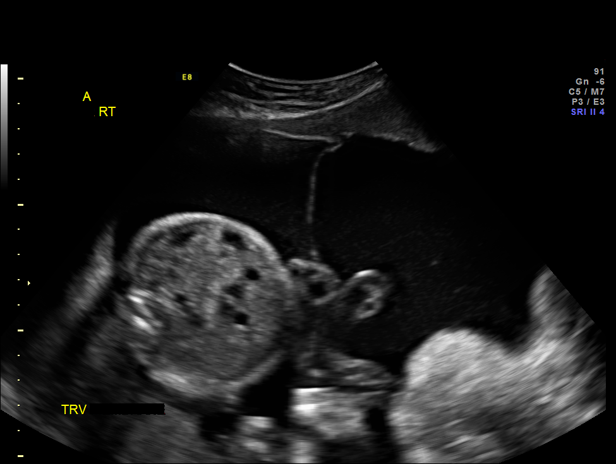
[im 12/54]
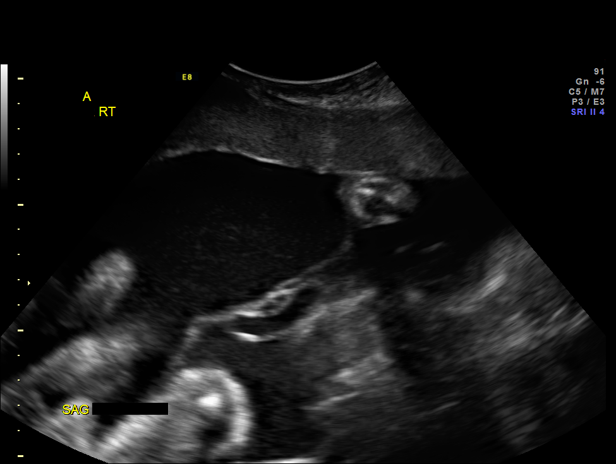
[im 16/54]
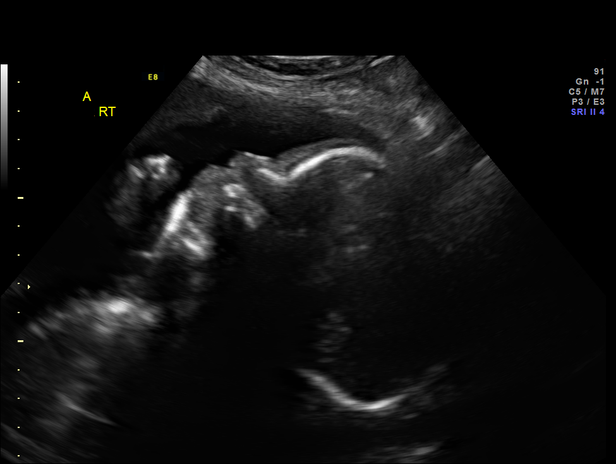
[im 20/54]
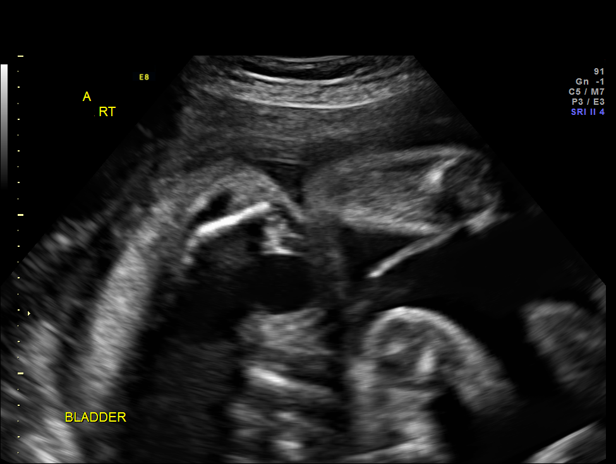
[im 24/54]
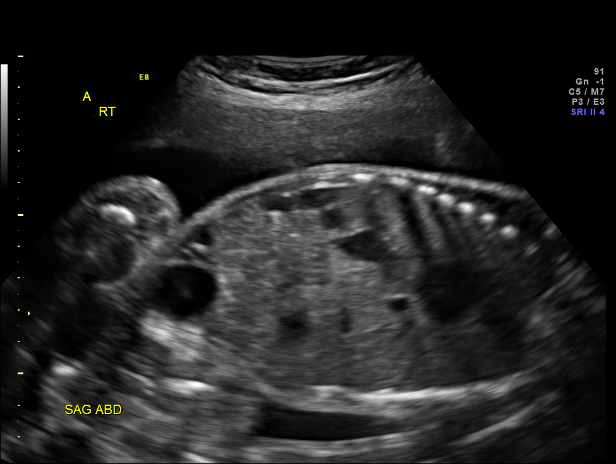
[im 28/54]
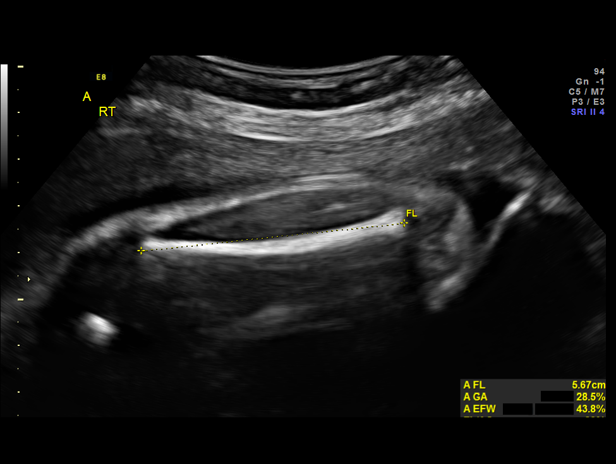
[im 30/54]
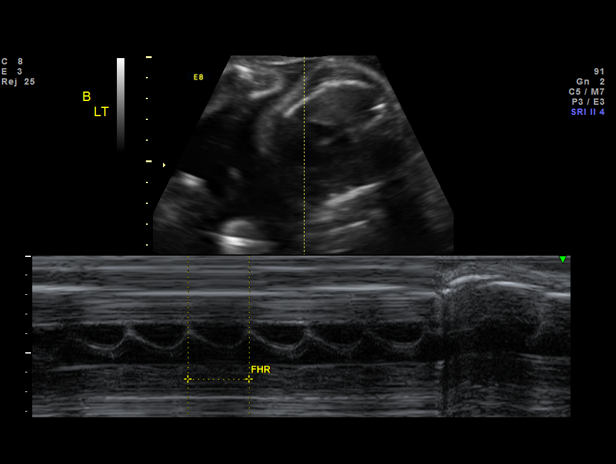
[im 34/54]
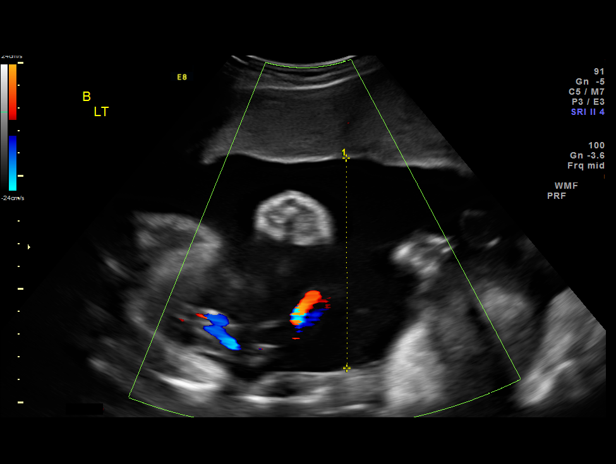
[im 38/54]
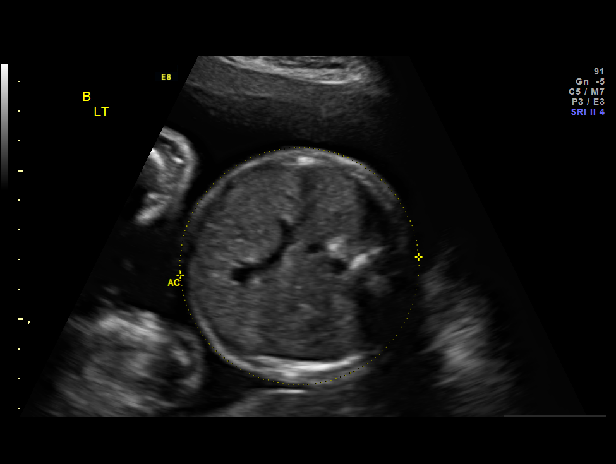
[im 42/54]
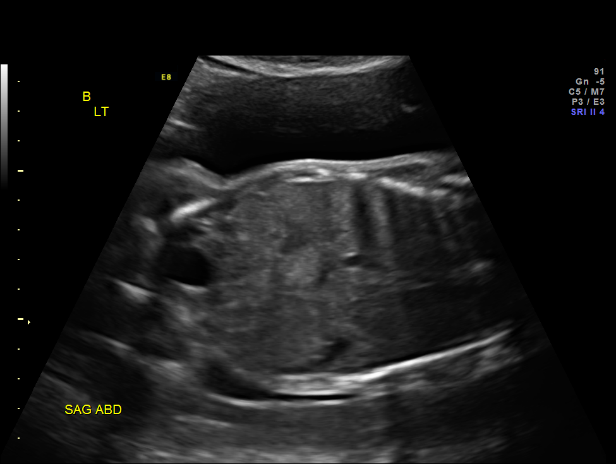
[im 46/54]
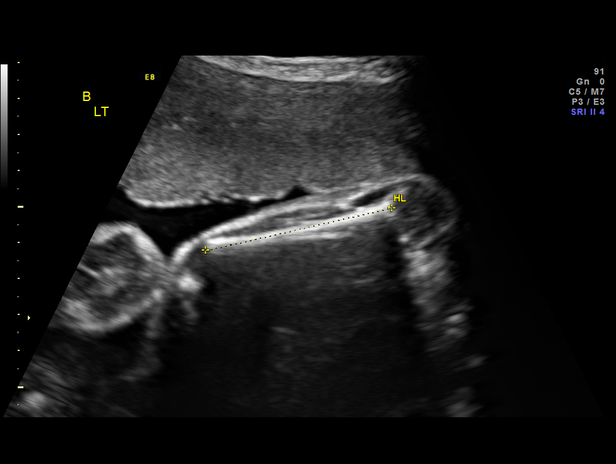
[im 50/54]
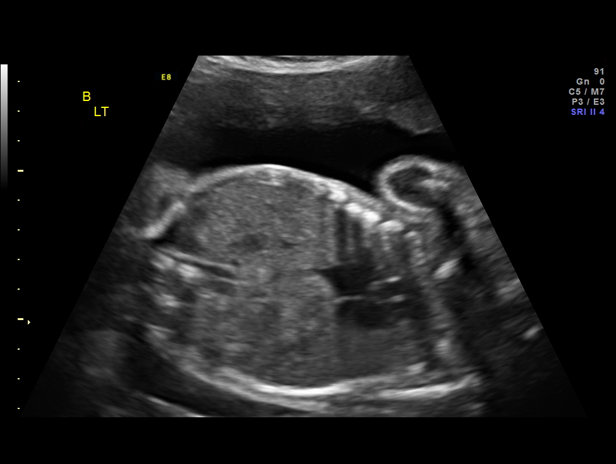
[im 54/54]
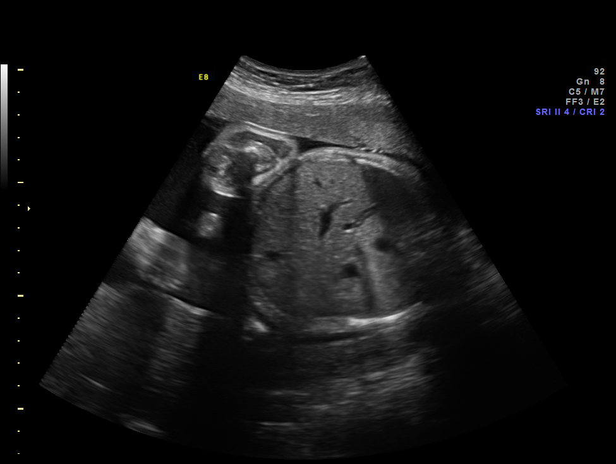

[15 of 28 positions shown; findings below may reference images not displayed]

OBSTETRICS REPORT
                      (Signed Final 06/28/2013 [DATE])

Service(s) Provided

 US OB FOLLOW UP                                       76816.1
 US OB FOLLOW UP ADDL GEST                             76816.2
Indications

 Advanced maternal age (42), Multigravida -
 declined screening
 Twin gestation, Di-Di
Fetal Evaluation (Fetus A)

 Num Of Fetuses:    2
 Fetal Heart Rate:  153                          bpm
 Cardiac Activity:  Observed
 Fetal Lie:         Lower right Fetus
 Presentation:      Cephalic
 Placenta:          Posterior, above cervical
                    os
 P. Cord            Previously Visualized
 Insertion:

 Membrane Desc:     Dividing
                    Membrane seen
                    - Dichorionic.

 Amniotic Fluid
 AFI FV:      Subjectively upper-normal
                                             Larg Pckt:     5.0  cm
Biometry (Fetus A)

 BPD:       77  mm     G. Age:  30w 6d                CI:         78.6   70 - 86
 OFD:       98  mm                                    FL/HC:      20.4   19.2 -

 HC:     280.1  mm     G. Age:  30w 5d       34  %    HC/AC:      1.13   0.99 -

 AC:     247.2  mm     G. Age:  28w 6d       18  %    FL/BPD:     74.2   71 - 87
 FL:      57.1  mm     G. Age:  30w 0d       34  %    FL/AC:      23.1   20 - 24
 HUM:     52.5  mm     G. Age:  30w 4d       64  %

 Est. FW:    6863  gm      3 lb 2 oz     45  %     FW Discordancy         2  %
Gestational Age (Fetus A)
 U/S Today:     30w 1d                                        EDD:   09/05/13
 Best:          30w 0d     Det. By:  Early Ultrasound         EDD:   09/06/13
Anatomy (Fetus A)

 Cranium:          Appears normal         Aortic Arch:      Previously seen
 Fetal Cavum:      Appears normal         Ductal Arch:      Previously seen
 Ventricles:       Appears normal         Diaphragm:        Previously seen
 Choroid Plexus:   Previously seen        Stomach:          Appears normal, left
                                                            sided
 Cerebellum:       Previously seen        Abdomen:          Appears normal
 Posterior Fossa:  Previously seen        Abdominal Wall:   Previously seen
 Nuchal Fold:      Previously seen        Cord Vessels:     Previously seen
 Face:             Orbits and profile     Kidneys:          Appear normal
                   previously seen
 Lips:             Previously seen        Bladder:          Appears normal
 Heart:            Appears normal         Spine:            Previously seen
                   (4CH, axis, and
                   situs)
 RVOT:             Previously seen        Lower             Previously seen
                                          Extremities:
 LVOT:             Previously seen        Upper             Previously seen
                                          Extremities:

 Other:  Fetus appears to be a female. Heels and 5th digit previously seen.

Fetal Evaluation (Fetus B)

 Num Of Fetuses:    2
 Fetal Heart Rate:  139                          bpm
 Cardiac Activity:  Observed
 Fetal Lie:         Upper Fetus
 Presentation:      Cephalic
 Placenta:          Anterior, above cervical os
 P. Cord            Previously Visualized
 Insertion:

 Membrane Desc:     Dividing
                    Membrane seen
                    - Dichorionic.

 Amniotic Fluid
 AFI FV:      Subjectively upper-normal
                                             Larg Pckt:     9.3  cm
Biometry (Fetus B)

 BPD:       76  mm     G. Age:  30w 4d                CI:         76.8   70 - 86
 OFD:     98.9  mm                                    FL/HC:      20.4   19.2 -

 HC:     279.4  mm     G. Age:  30w 4d       31  %    HC/AC:      1.11   0.99 -

 AC:       252  mm     G. Age:  29w 3d       27  %    FL/BPD:     75.0   71 - 87
 FL:        57  mm     G. Age:  29w 6d       33  %    FL/AC:      22.6   20 - 24
 HUM:     53.4  mm     G. Age:  31w 1d       72  %

 Est. FW:    0883  gm      3 lb 3 oz     49  %     FW Discordancy      0 \ 2 %
Gestational Age (Fetus B)
 U/S Today:     30w 1d                                        EDD:   09/05/13
 Best:          30w 0d     Det. By:  Early Ultrasound         EDD:   09/06/13
Anatomy (Fetus B)

 Cranium:          Appears normal         Aortic Arch:      Previously seen
 Fetal Cavum:      Appears normal         Ductal Arch:      Previously seen
 Ventricles:       Appears normal         Diaphragm:        Previously seen
 Choroid Plexus:   Previously seen        Stomach:          Appears normal, left
                                                            sided
 Cerebellum:       Previously seen        Abdomen:          Appears normal
 Posterior Fossa:  Previously seen        Abdominal Wall:   Previously seen
 Nuchal Fold:      Previously seen        Cord Vessels:     Previously seen
 Face:             Orbits and profile     Kidneys:          Appear normal
                   previously seen
 Lips:             Previously seen        Bladder:          Appears normal
 Heart:            Appears normal         Spine:            Previously seen
                   (4CH, axis, and
                   situs)
 RVOT:             Previously seen        Lower             Previously seen
                                          Extremities:
 LVOT:             Previously seen        Upper             Previously seen
                                          Extremities:

 Other:  Fetus appears to be a male. Heels and 5th digit previously seen.
Cervix Uterus Adnexa

 Cervix:       Not visualized (advanced GA >69wks)

 Adnexa:     No abnormality visualized.
Impression

 Dichorionic/diamniotic twin pregnancy at 30+0 weeks
 Normal interval anatomy; anatomic survey complete x 2 (Twin
 B - small stomach)
 Twin A: Normal amniotic fluid volume; Twin B: High normal
 amniotic fluid volume
 Appropriate interval growths with EFWs at the 45th and 49th
 %tiles
Recommendations

 Follow-up ultrasound for growth in 4 weeks

## 2013-07-26 ENCOUNTER — Other Ambulatory Visit (HOSPITAL_COMMUNITY): Payer: Self-pay | Admitting: Obstetrics and Gynecology

## 2013-07-26 DIAGNOSIS — O30009 Twin pregnancy, unspecified number of placenta and unspecified number of amniotic sacs, unspecified trimester: Secondary | ICD-10-CM

## 2013-07-27 ENCOUNTER — Ambulatory Visit (HOSPITAL_COMMUNITY)
Admission: RE | Admit: 2013-07-27 | Discharge: 2013-07-27 | Disposition: A | Discharge status: Home / Self Care | Payer: 59 | Source: Ambulatory Visit | Attending: Obstetrics and Gynecology | Admitting: Obstetrics and Gynecology

## 2013-07-27 DIAGNOSIS — O09529 Supervision of elderly multigravida, unspecified trimester: Secondary | ICD-10-CM | POA: Insufficient documentation

## 2013-07-27 DIAGNOSIS — O30009 Twin pregnancy, unspecified number of placenta and unspecified number of amniotic sacs, unspecified trimester: Secondary | ICD-10-CM | POA: Insufficient documentation

## 2013-07-27 IMAGING — US US OB FOLLOW-UP EACH ADDL GEST (MODIFY)
1 series · 15 of 28 positions shown · non-contrast
Comparison: none

[Series 1: us ob follow-up each addl gest (modify) · 0.35mm/px · 15 of 54 slices shown]
[im 1/54]
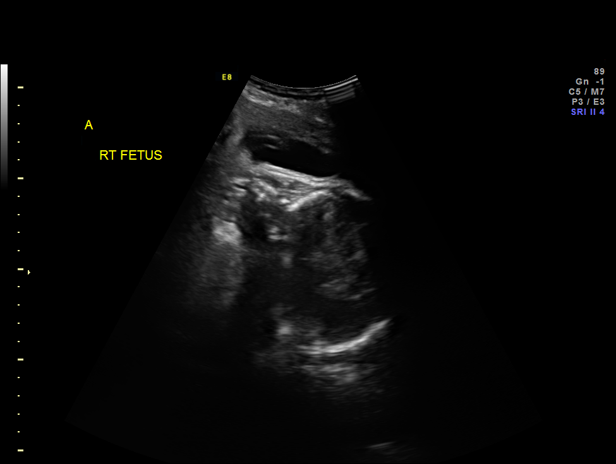
[im 4/54]
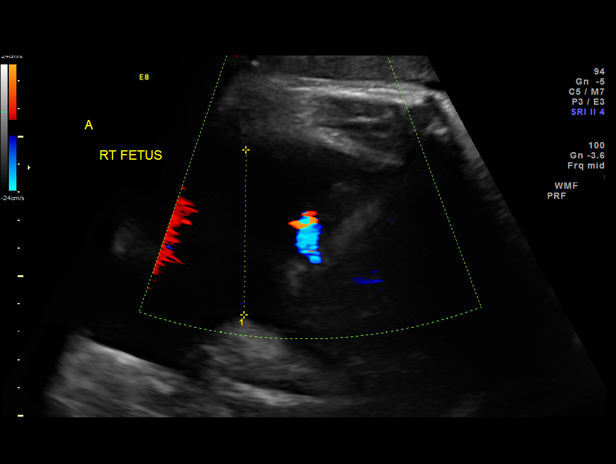
[im 8/54]
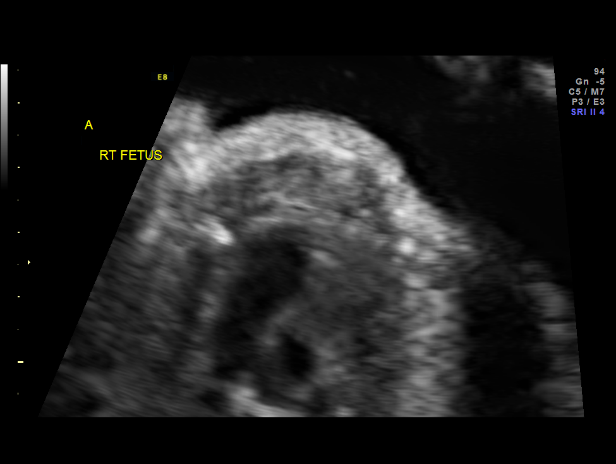
[im 12/54]
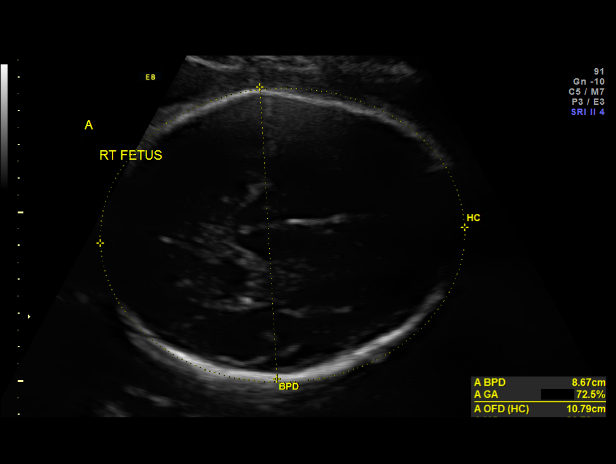
[im 16/54]
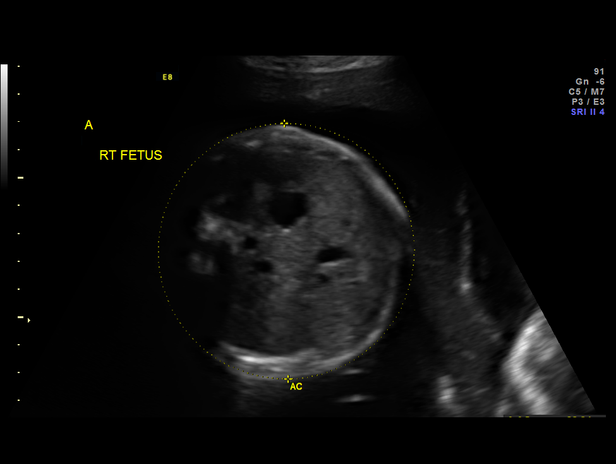
[im 20/54]
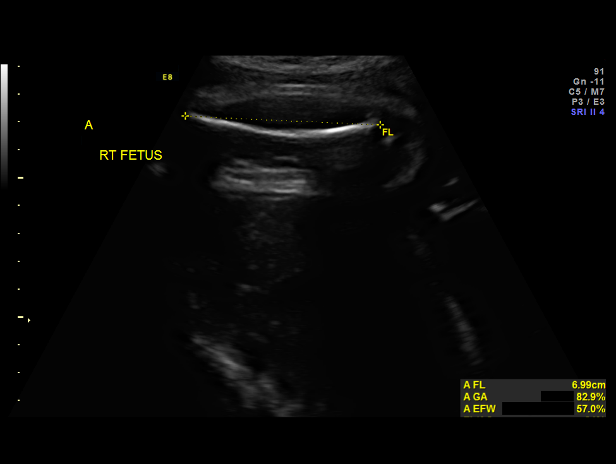
[im 24/54]
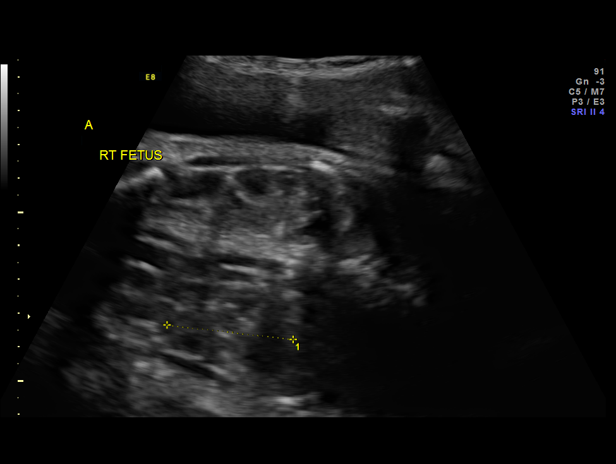
[im 28/54]
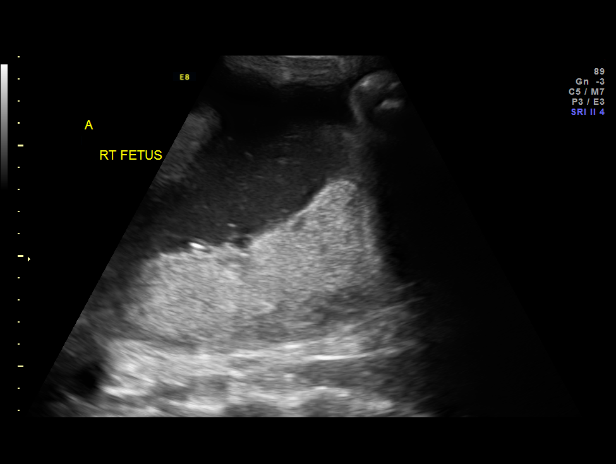
[im 30/54]
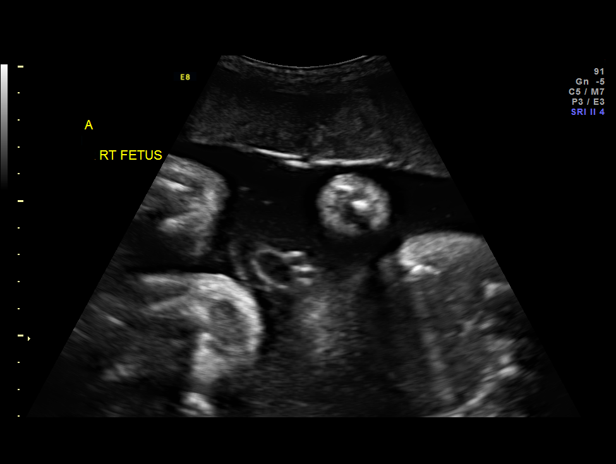
[im 34/54]
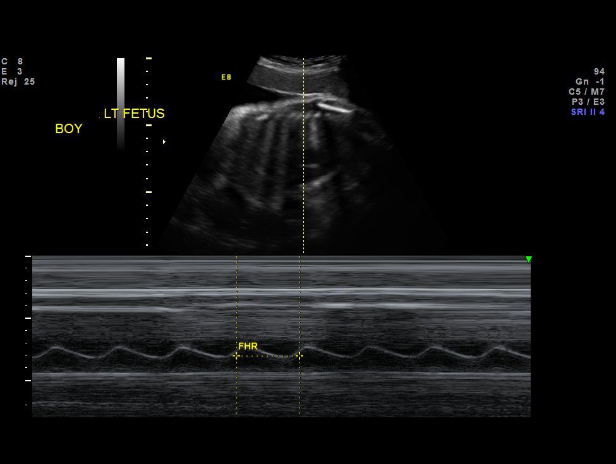
[im 38/54]
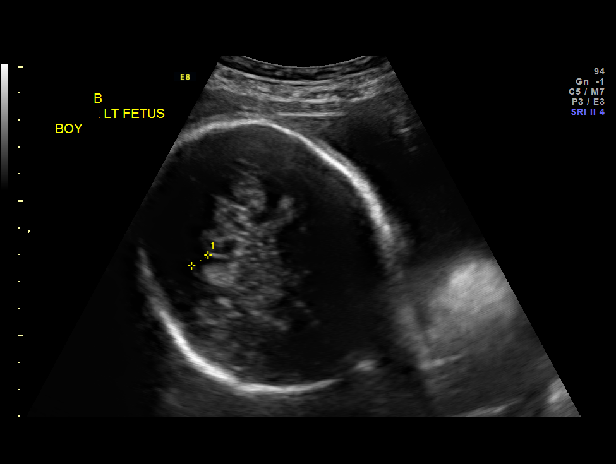
[im 42/54]
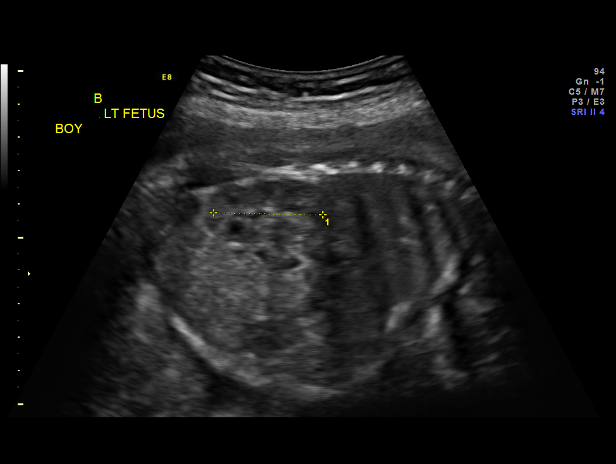
[im 46/54]
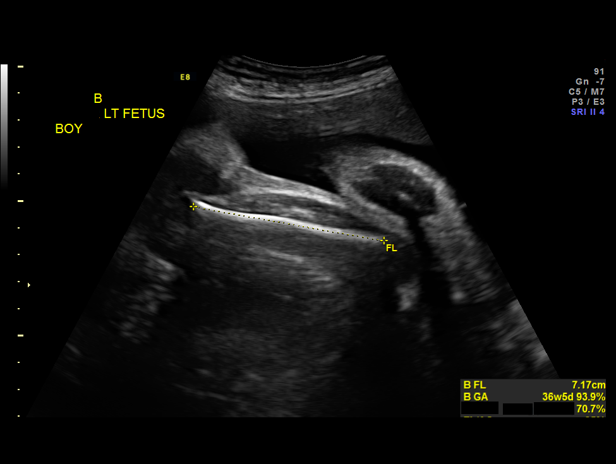
[im 50/54]
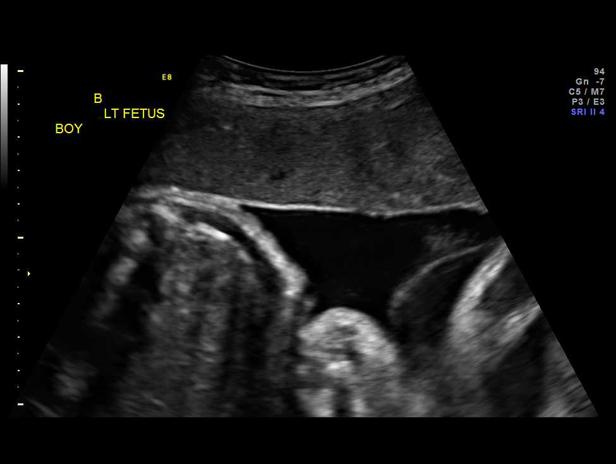
[im 54/54]
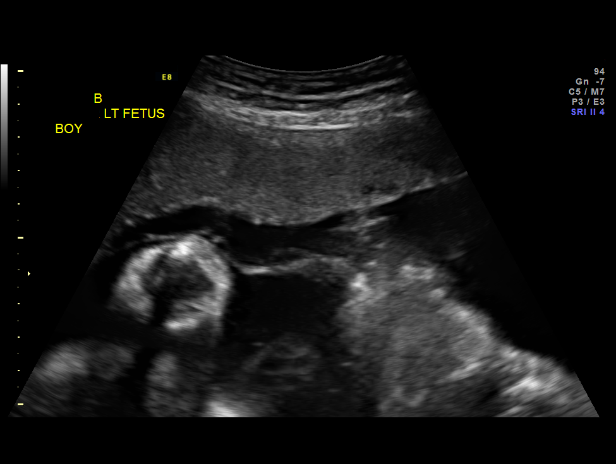

[15 of 28 positions shown; findings below may reference images not displayed]

OBSTETRICS REPORT
                      (Signed Final 07/27/2013 [DATE])

Service(s) Provided

 US OB FOLLOW UP                                       76816.1
 US OB FOLLOW UP ADDL GEST                             76816.2
Indications

 Advanced maternal age (42), Multigravida -
 declined screening
 Twin gestation, Di-Di
Fetal Evaluation (Fetus A)

 Num Of Fetuses:    2
 Fetal Heart Rate:  141                          bpm
 Cardiac Activity:  Observed
 Presentation:      Cephalic
 Placenta:          Posterior, above cervical
                    os
 P. Cord            Previously Visualized
 Insertion:

 Membrane Desc:     Dividing
                    Membrane seen
                    - Dichorionic.

 Amniotic Fluid
 AFI FV:      Subjectively within normal limits
                                             Larg Pckt:     5.9  cm
Biometry (Fetus A)

 BPD:     86.2  mm     G. Age:  34w 5d                CI:         81.1   70 - 86
 OFD:    106.3  mm                                    FL/HC:      22.2   19.4 -

 HC:       304  mm     G. Age:  33w 6d       11  %    HC/AC:      1.05   0.96 -

 AC:     289.7  mm     G. Age:  33w 0d       22  %    FL/BPD:     78.3   71 - 87
 FL:      67.5  mm     G. Age:  34w 5d       55  %    FL/AC:      23.3   20 - 24

 Est. FW:    2202  gm           5 lb     51  %     FW Discordancy        11  %
Gestational Age (Fetus A)

 U/S Today:     34w 0d                                        EDD:   09/07/13
 Best:          34w 1d     Det. By:  Early Ultrasound         EDD:   09/06/13
Anatomy (Fetus A)

 Cranium:          Appears normal         Aortic Arch:      Previously seen
 Fetal Cavum:      Previously seen        Ductal Arch:      Previously seen
 Ventricles:       Appears normal         Diaphragm:        Previously seen
 Choroid Plexus:   Previously seen        Stomach:          Appears normal, left
                                                            sided
 Cerebellum:       Previously seen        Abdomen:          Appears normal
 Posterior Fossa:  Previously seen        Abdominal Wall:   Previously seen
 Nuchal Fold:      Previously seen        Cord Vessels:     Previously seen
 Face:             Orbits and profile     Kidneys:          Appear normal
                   previously seen
 Lips:             Previously seen        Bladder:          Appears normal
 Heart:            Appears normal         Spine:            Previously seen
                   (4CH, axis, and
                   situs)
 RVOT:             Appears normal         Lower             Previously seen
                                          Extremities:
 LVOT:             Appears normal         Upper             Previously seen
                                          Extremities:

 Other:  Fetus appears to be a female. Heels and 5th digit previously seen.

Fetal Evaluation (Fetus B)

 Num Of Fetuses:    2
 Fetal Heart Rate:  135                          bpm
 Cardiac Activity:  Observed
 Presentation:      Cephalic
 Placenta:          Anterior, above cervical os
 P. Cord            Visualized, central
 Insertion:

 Membrane Desc:     Dividing
                    Membrane seen
                    - Dichorionic.
Biometry (Fetus B)

 BPD:     88.8  mm     G. Age:  35w 6d                CI:         79.1   70 - 86
 OFD:    112.3  mm                                    FL/HC:      22.4   19.4 -

 HC:     321.2  mm     G. Age:  36w 2d       68  %    HC/AC:      1.10   0.96 -

 AC:     293.1  mm     G. Age:  33w 2d       30  %    FL/BPD:     81.1   71 - 87
 FL:        72  mm     G. Age:  36w 6d       94  %    FL/AC:      24.6   20 - 24

 Est. FW:    5083  gm    5 lb 10 oz      75  %     FW Discordancy     0 \ 11 %
Gestational Age (Fetus B)

 U/S Today:     35w 4d                                        EDD:   08/27/13
 Best:          34w 1d     Det. By:  Early Ultrasound         EDD:   09/06/13
Anatomy (Fetus B)

 Cranium:          Appears normal         Aortic Arch:      Previously seen
 Fetal Cavum:      Previously seen        Ductal Arch:      Previously seen
 Ventricles:       Appears normal         Diaphragm:        Previously seen
 Choroid Plexus:   Previously seen        Stomach:          Appears normal, left
                                                            sided
 Cerebellum:       Previously seen        Abdomen:          Appears normal
 Posterior Fossa:  Previously seen        Abdominal Wall:   Previously seen
 Nuchal Fold:      Previously seen        Cord Vessels:     Previously seen
 Face:             Orbits and profile     Kidneys:          Appear normal
                   previously seen
 Lips:             Previously seen        Bladder:          Appears normal
 Heart:            Previously seen        Spine:            Previously seen
 RVOT:             Previously seen        Lower             Previously seen
                                          Extremities:
 LVOT:             Previously seen        Upper             Previously seen
                                          Extremities:

 Other:  Fetus appears to be a male. Heels and 5th digit previously seen.
Cervix Uterus Adnexa

 Cervix:       Not visualized (advanced GA >67wks)
Impression

 Dichorionic/diamniotic twin pregnancy at 34+1 weeks
 Cephalic/cephalic presentation
 Normal interval anatomy; anatomic survey complete x 2
 Normal amniotic fluid volume x 2
 Appropriate interval growths with EFWs at the 51st and 75th
 %tiles
Recommendations

 Follow-up as clinically indicated
 Delivery by 38 weeks

## 2014-02-08 ENCOUNTER — Encounter (HOSPITAL_COMMUNITY): Payer: Self-pay | Admitting: *Deleted

## 2014-07-15 ENCOUNTER — Encounter (HOSPITAL_COMMUNITY): Payer: Self-pay | Admitting: *Deleted

## 2018-08-30 ENCOUNTER — Encounter (HOSPITAL_BASED_OUTPATIENT_CLINIC_OR_DEPARTMENT_OTHER): Payer: Self-pay | Admitting: *Deleted

## 2018-08-30 ENCOUNTER — Emergency Department (HOSPITAL_BASED_OUTPATIENT_CLINIC_OR_DEPARTMENT_OTHER)
Admission: EM | Admit: 2018-08-30 | Discharge: 2018-08-30 | Disposition: A | Discharge status: Home / Self Care | Payer: 59 | Attending: Emergency Medicine | Admitting: Emergency Medicine

## 2018-08-30 ENCOUNTER — Other Ambulatory Visit: Payer: Self-pay

## 2018-08-30 DIAGNOSIS — H6692 Otitis media, unspecified, left ear: Secondary | ICD-10-CM | POA: Diagnosis not present

## 2018-08-30 DIAGNOSIS — J028 Acute pharyngitis due to other specified organisms: Secondary | ICD-10-CM

## 2018-08-30 DIAGNOSIS — J029 Acute pharyngitis, unspecified: Secondary | ICD-10-CM | POA: Insufficient documentation

## 2018-08-30 DIAGNOSIS — R070 Pain in throat: Secondary | ICD-10-CM | POA: Diagnosis present

## 2018-08-30 MED ORDER — AMOXICILLIN 500 MG PO CAPS: 500.0000 mg | ORAL_CAPSULE | Freq: Three times a day (TID) | ORAL | 0 refills | Status: AC

## 2018-08-30 NOTE — ED Triage Notes (Signed)
Pt c/o sore throat x 2 weeks unable to swallow.  Also c/o h/a each pm

## 2018-08-30 NOTE — Discharge Instructions (Signed)
Return if any problems.

## 2018-08-30 NOTE — ED Provider Notes (Signed)
[facility] EMERGENCY DEPARTMENT Provider Note   CSN: [id number] Arrival date & time: 08/30/18  1504     History   Chief Complaint Chief Complaint  Patient presents with  . Sore Throat    HPI Heidi Sims is a 47 y.o. female.  The history is provided by the patient. No language interpreter was used.  Sore Throat  This is a new problem. The problem occurs constantly. The problem has been gradually worsening. Pertinent negatives include no chest pain. Nothing aggravates the symptoms. Nothing relieves the symptoms. She has tried nothing for the symptoms.  Pt complains of left ear pain and a sore throat.   History reviewed. No pertinent past medical history.  There are no active problems to display for this patient.   Past Surgical History:  Procedure Laterality Date  . CESAREAN SECTION       OB History    Gravida  4   Para  3   Term  3   Preterm  0   AB  0   Living  3     SAB  0   TAB  0   Ectopic  0   Multiple  0   Live Births               Home Medications    Prior to Admission medications   Medication Sig Start Date End Date Taking? Authorizing Provider  acetaminophen (TYLENOL) 500 MG tablet Take 500 mg by mouth once.     Yes [provider]  Vitamin D, Ergocalciferol, (DRISDOL) 50000 UNITS CAPS Take 50,000 Units by mouth every 7 (seven) days. Take on Tuesday     [provider]    Family History No family history on file.  Social History Social History   Tobacco Use  . Smoking status: Never Smoker  Substance Use Topics  . Alcohol use: No  . Drug use: No     Allergies   Chocolate and Nsaids   Review of Systems Review of Systems  HENT: Positive for ear pain and sore throat.   Cardiovascular: Negative for chest pain.  All other systems reviewed and are negative.    Physical Exam Updated Vital Signs BP 120/84 (BP Location: Right Arm)   Pulse 64   Temp 98.2 F (36.8 C) (Oral)   Resp 16    Ht 5\' 3"  (1.6 m)   Wt 80.3 kg   LMP 08/29/2018   SpO2 100%   BMI 31.35 kg/m   Physical Exam Vitals signs and nursing note reviewed.  Constitutional:      Appearance: She is well-developed.  HENT:     Head: Normocephalic.     Comments: Left tm is erythematous, right tm is dull,     Mouth/Throat:     Mouth: Mucous membranes are moist.     Pharynx: Posterior oropharyngeal erythema present.  Eyes:     Conjunctiva/sclera: Conjunctivae normal.  Neck:     Musculoskeletal: Normal range of motion.  Cardiovascular:     Rate and Rhythm: Normal rate.  Pulmonary:     Effort: Pulmonary effort is normal.  Abdominal:     General: There is no distension.  Musculoskeletal: Normal range of motion.  Skin:    General: Skin is warm.  Neurological:     Mental Status: She is alert and oriented to person, place, and time.      ED Treatments / Results  Labs (all labs ordered are listed, but only abnormal results  are displayed) Labs Reviewed - No data to display  EKG None  Radiology No results found.  Procedures Procedures (including critical care time)  Medications Ordered in ED Medications - No data to display   Initial Impression / Assessment and Plan / ED Course  I have reviewed the triage vital signs and the nursing notes.  Pertinent labs & imaging results that were available during my care of the patient were reviewed by me and considered in my medical decision making (see chart for details).      Final Clinical Impressions(s) / ED Diagnoses   Final diagnoses:  Pharyngitis due to other organism  Left otitis media, unspecified otitis media type    ED Discharge Orders         Ordered    amoxicillin (AMOXIL) 500 MG capsule  3 times daily     08/30/18 1547           Osie Cheeks 08/30/18 1547    Gwyneth Sprout, MD 08/30/18 2007

## 2020-02-02 ENCOUNTER — Ambulatory Visit: Payer: Worker's Compensation | Attending: Internal Medicine

## 2020-02-02 DIAGNOSIS — Z23 Encounter for immunization: Secondary | ICD-10-CM

## 2020-02-02 NOTE — Progress Notes (Signed)
   Covid-19 Vaccination Clinic  Name:  Heidi Sims    MRN: [id number] DOB: 30-May-1971  02/02/2020  [name] was observed post Covid-19 immunization for 15 minutes without incident. She was provided with Vaccine Information Sheet and instruction to access the V-Safe system.   [name] was instructed to call 911 with any severe reactions post vaccine: Marland Kitchen Difficulty breathing  . Swelling of face and throat  . A fast heartbeat  . A bad rash all over body  . Dizziness and weakness   Immunizations Administered    Name Date Dose VIS Date Route   Pfizer COVID-19 Vaccine 02/02/2020  9:15 AM 0.3 mL 11/07/2018 Intramuscular   Manufacturer: [employer], [employer]   Lot: [id number]   NDC: [address]-3
